# Patient Record
Sex: Female | Born: 1985 | Race: White | Hispanic: No | Smoking: Current every day smoker
Health system: Southern US, Community
[De-identification: ages and names within clinical notes are randomized; demographics above are authoritative.]

## PROBLEM LIST (undated history)

## (undated) DIAGNOSIS — F319 Bipolar disorder, unspecified: Secondary | ICD-10-CM

## (undated) DIAGNOSIS — F411 Generalized anxiety disorder: Secondary | ICD-10-CM

## (undated) DIAGNOSIS — F1911 Other psychoactive substance abuse, in remission: Secondary | ICD-10-CM

## (undated) DIAGNOSIS — F909 Attention-deficit hyperactivity disorder, unspecified type: Secondary | ICD-10-CM

## (undated) DIAGNOSIS — Z8742 Personal history of other diseases of the female genital tract: Secondary | ICD-10-CM

## (undated) DIAGNOSIS — R87629 Unspecified abnormal cytological findings in specimens from vagina: Secondary | ICD-10-CM

## (undated) HISTORY — PX: NO PAST SURGERIES: SHX2092

## (undated) HISTORY — PX: LEEP: SHX91

---
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
## 2007-05-16 ENCOUNTER — Emergency Department (HOSPITAL_COMMUNITY): Admission: EM | Admit: 2007-05-16 | Discharge: 2007-05-16 | Payer: Self-pay | Admitting: Family Medicine

## 2008-07-31 ENCOUNTER — Emergency Department (HOSPITAL_COMMUNITY): Admission: EM | Admit: 2008-07-31 | Discharge: 2008-07-31 | Payer: Self-pay | Admitting: Emergency Medicine

## 2008-08-08 ENCOUNTER — Emergency Department (HOSPITAL_COMMUNITY): Admission: EM | Admit: 2008-08-08 | Discharge: 2008-08-08 | Payer: Self-pay | Admitting: Family Medicine

## 2009-09-26 ENCOUNTER — Emergency Department (HOSPITAL_COMMUNITY): Admission: EM | Admit: 2009-09-26 | Discharge: 2009-09-26 | Payer: Self-pay | Admitting: Emergency Medicine

## 2009-10-25 ENCOUNTER — Ambulatory Visit (HOSPITAL_COMMUNITY): Admission: RE | Admit: 2009-10-25 | Discharge: 2009-10-25 | Payer: Self-pay | Admitting: Psychiatry

## 2010-10-02 LAB — URINALYSIS, ROUTINE W REFLEX MICROSCOPIC
Bilirubin Urine: NEGATIVE
Ketones, ur: NEGATIVE mg/dL
Nitrite: NEGATIVE
Protein, ur: 100 mg/dL — AB
Specific Gravity, Urine: 1.025 (ref 1.005–1.030)
Urobilinogen, UA: 0.2 mg/dL (ref 0.0–1.0)

## 2010-10-02 LAB — URINE MICROSCOPIC-ADD ON

## 2010-10-02 LAB — GC/CHLAMYDIA PROBE AMP, GENITAL
Chlamydia, DNA Probe: POSITIVE — AB
GC Probe Amp, Genital: NEGATIVE

## 2010-10-02 LAB — WET PREP, GENITAL
Clue Cells Wet Prep HPF POC: NONE SEEN
Trich, Wet Prep: NONE SEEN
Yeast Wet Prep HPF POC: NONE SEEN

## 2010-10-02 LAB — POCT PREGNANCY, URINE: Preg Test, Ur: NEGATIVE

## 2010-10-31 ENCOUNTER — Emergency Department (HOSPITAL_COMMUNITY): Payer: No Typology Code available for payment source

## 2010-10-31 ENCOUNTER — Emergency Department (HOSPITAL_COMMUNITY)
Admission: EM | Admit: 2010-10-31 | Discharge: 2010-10-31 | Disposition: A | Discharge status: Home / Self Care | Payer: No Typology Code available for payment source | Attending: Emergency Medicine | Admitting: Emergency Medicine

## 2010-10-31 DIAGNOSIS — S63509A Unspecified sprain of unspecified wrist, initial encounter: Secondary | ICD-10-CM | POA: Insufficient documentation

## 2010-10-31 DIAGNOSIS — S93609A Unspecified sprain of unspecified foot, initial encounter: Secondary | ICD-10-CM | POA: Insufficient documentation

## 2010-10-31 DIAGNOSIS — IMO0002 Reserved for concepts with insufficient information to code with codable children: Secondary | ICD-10-CM | POA: Insufficient documentation

## 2010-10-31 DIAGNOSIS — Y92410 Unspecified street and highway as the place of occurrence of the external cause: Secondary | ICD-10-CM | POA: Insufficient documentation

## 2010-11-08 ENCOUNTER — Ambulatory Visit (HOSPITAL_COMMUNITY)
Admission: RE | Admit: 2010-11-08 | Discharge: 2010-11-08 | Disposition: A | Discharge status: Home / Self Care | Payer: BC Managed Care – PPO | Attending: Psychiatry | Admitting: Psychiatry

## 2010-11-08 DIAGNOSIS — F39 Unspecified mood [affective] disorder: Secondary | ICD-10-CM | POA: Insufficient documentation

## 2010-11-08 DIAGNOSIS — F1920 Other psychoactive substance dependence, uncomplicated: Secondary | ICD-10-CM | POA: Insufficient documentation

## 2010-11-12 ENCOUNTER — Emergency Department (HOSPITAL_COMMUNITY)
Admission: EM | Admit: 2010-11-12 | Discharge: 2010-11-13 | Disposition: A | Discharge status: Other Acute Inpatient Hospital | Payer: BC Managed Care – PPO | Attending: Emergency Medicine | Admitting: Emergency Medicine

## 2010-11-12 DIAGNOSIS — F1910 Other psychoactive substance abuse, uncomplicated: Secondary | ICD-10-CM | POA: Insufficient documentation

## 2010-11-12 DIAGNOSIS — F988 Other specified behavioral and emotional disorders with onset usually occurring in childhood and adolescence: Secondary | ICD-10-CM | POA: Insufficient documentation

## 2010-11-12 LAB — DIFFERENTIAL
Basophils Absolute: 0 10*3/uL (ref 0.0–0.1)
Lymphocytes Relative: 47 % — ABNORMAL HIGH (ref 12–46)
Lymphs Abs: 3.5 10*3/uL (ref 0.7–4.0)
Neutro Abs: 3.3 10*3/uL (ref 1.7–7.7)
Neutrophils Relative %: 44 % (ref 43–77)

## 2010-11-12 LAB — CBC
HCT: 41.1 % (ref 36.0–46.0)
MCV: 96 fL (ref 78.0–100.0)
RBC: 4.28 MIL/uL (ref 3.87–5.11)
RDW: 11.9 % (ref 11.5–15.5)
WBC: 7.5 10*3/uL (ref 4.0–10.5)

## 2010-11-12 LAB — URINALYSIS, ROUTINE W REFLEX MICROSCOPIC
Ketones, ur: NEGATIVE mg/dL
Nitrite: NEGATIVE
Protein, ur: NEGATIVE mg/dL
Urobilinogen, UA: 0.2 mg/dL (ref 0.0–1.0)

## 2010-11-12 LAB — COMPREHENSIVE METABOLIC PANEL
ALT: 12 U/L (ref 0–35)
AST: 16 U/L (ref 0–37)
Alkaline Phosphatase: 56 U/L (ref 39–117)
CO2: 23 mEq/L (ref 19–32)
Calcium: 9.2 mg/dL (ref 8.4–10.5)
GFR calc Af Amer: >60 mL/min (ref >60)
Glucose, Bld: 79 mg/dL (ref 70–99)
Potassium: 3.3 mEq/L — ABNORMAL LOW (ref 3.5–5.1)
Sodium: 141 mEq/L (ref 135–145)
Total Protein: 6.9 g/dL (ref 6.0–8.3)

## 2010-11-12 LAB — ETHANOL: Alcohol, Ethyl (B): <11 mg/dL — ABNORMAL HIGH (ref 0–10)

## 2010-11-12 LAB — RAPID URINE DRUG SCREEN, HOSP PERFORMED
Benzodiazepines: NOT DETECTED
Cocaine: NOT DETECTED
Tetrahydrocannabinol: POSITIVE — AB

## 2010-11-12 LAB — URINE MICROSCOPIC-ADD ON

## 2010-11-13 ENCOUNTER — Inpatient Hospital Stay (HOSPITAL_COMMUNITY)
Admission: AD | Admit: 2010-11-13 | Discharge: 2010-11-17 | DRG: 745 | Disposition: A | Discharge status: Home / Self Care | Payer: BC Managed Care – PPO | Source: Ambulatory Visit | Attending: Psychiatry | Admitting: Psychiatry

## 2010-11-13 DIAGNOSIS — Z6379 Other stressful life events affecting family and household: Secondary | ICD-10-CM

## 2010-11-13 DIAGNOSIS — F1920 Other psychoactive substance dependence, uncomplicated: Principal | ICD-10-CM

## 2010-11-13 DIAGNOSIS — F39 Unspecified mood [affective] disorder: Secondary | ICD-10-CM

## 2010-11-13 DIAGNOSIS — K5900 Constipation, unspecified: Secondary | ICD-10-CM

## 2010-11-13 DIAGNOSIS — F411 Generalized anxiety disorder: Secondary | ICD-10-CM

## 2010-11-13 DIAGNOSIS — Z818 Family history of other mental and behavioral disorders: Secondary | ICD-10-CM

## 2010-11-13 DIAGNOSIS — G8929 Other chronic pain: Secondary | ICD-10-CM

## 2010-11-14 DIAGNOSIS — F1920 Other psychoactive substance dependence, uncomplicated: Secondary | ICD-10-CM

## 2010-11-20 ENCOUNTER — Other Ambulatory Visit (HOSPITAL_COMMUNITY): Payer: BC Managed Care – PPO | Attending: Psychiatry | Admitting: Psychiatry

## 2010-11-20 DIAGNOSIS — F1920 Other psychoactive substance dependence, uncomplicated: Secondary | ICD-10-CM | POA: Insufficient documentation

## 2010-11-22 ENCOUNTER — Other Ambulatory Visit (HOSPITAL_BASED_OUTPATIENT_CLINIC_OR_DEPARTMENT_OTHER): Payer: BC Managed Care – PPO | Admitting: Psychiatry

## 2010-11-22 DIAGNOSIS — F1020 Alcohol dependence, uncomplicated: Secondary | ICD-10-CM
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
## 2010-11-24 ENCOUNTER — Other Ambulatory Visit (HOSPITAL_COMMUNITY): Payer: BC Managed Care – PPO | Admitting: Psychiatry

## 2010-11-27 ENCOUNTER — Other Ambulatory Visit (HOSPITAL_COMMUNITY): Payer: BC Managed Care – PPO | Admitting: Psychiatry

## 2010-11-29 ENCOUNTER — Other Ambulatory Visit (HOSPITAL_COMMUNITY): Payer: BC Managed Care – PPO | Admitting: Psychiatry

## 2010-12-01 ENCOUNTER — Other Ambulatory Visit (HOSPITAL_COMMUNITY): Payer: BC Managed Care – PPO | Admitting: Psychiatry

## 2010-12-05 ENCOUNTER — Other Ambulatory Visit (HOSPITAL_COMMUNITY): Payer: BC Managed Care – PPO | Attending: Psychiatry | Admitting: Psychiatry

## 2010-12-05 DIAGNOSIS — F1920 Other psychoactive substance dependence, uncomplicated: Secondary | ICD-10-CM | POA: Insufficient documentation

## 2010-12-06 ENCOUNTER — Other Ambulatory Visit (HOSPITAL_COMMUNITY): Payer: BC Managed Care – PPO | Admitting: Psychiatry

## 2010-12-06 ENCOUNTER — Other Ambulatory Visit (HOSPITAL_COMMUNITY): Payer: Self-pay | Admitting: Psychiatry

## 2010-12-07 LAB — DRUGS OF ABUSE SCREEN W/O ALC, ROUTINE URINE
Barbiturate Quant, Ur: NEGATIVE
Benzodiazepines.: NEGATIVE
Cocaine Metabolites: NEGATIVE
Methadone: NEGATIVE
Opiate Screen, Urine: NEGATIVE
Phencyclidine (PCP): NEGATIVE

## 2010-12-08 ENCOUNTER — Other Ambulatory Visit (HOSPITAL_COMMUNITY): Payer: No Typology Code available for payment source | Admitting: Psychiatry

## 2010-12-08 ENCOUNTER — Other Ambulatory Visit (HOSPITAL_COMMUNITY): Payer: BC Managed Care – PPO | Attending: Psychiatry | Admitting: Psychiatry

## 2010-12-08 DIAGNOSIS — Z6379 Other stressful life events affecting family and household: Secondary | ICD-10-CM | POA: Insufficient documentation

## 2010-12-08 DIAGNOSIS — F1920 Other psychoactive substance dependence, uncomplicated: Secondary | ICD-10-CM | POA: Insufficient documentation

## 2010-12-08 DIAGNOSIS — Z818 Family history of other mental and behavioral disorders: Secondary | ICD-10-CM | POA: Insufficient documentation

## 2010-12-11 ENCOUNTER — Other Ambulatory Visit (HOSPITAL_COMMUNITY): Payer: No Typology Code available for payment source | Admitting: Psychiatry

## 2010-12-11 ENCOUNTER — Other Ambulatory Visit (HOSPITAL_COMMUNITY): Payer: BC Managed Care – PPO | Admitting: Psychiatry

## 2010-12-13 ENCOUNTER — Other Ambulatory Visit (HOSPITAL_COMMUNITY): Payer: No Typology Code available for payment source | Admitting: Psychiatry

## 2010-12-13 ENCOUNTER — Other Ambulatory Visit (HOSPITAL_COMMUNITY): Payer: BC Managed Care – PPO | Admitting: Psychiatry

## 2010-12-15 ENCOUNTER — Other Ambulatory Visit (HOSPITAL_COMMUNITY): Payer: No Typology Code available for payment source | Admitting: Psychiatry

## 2010-12-15 ENCOUNTER — Other Ambulatory Visit (HOSPITAL_COMMUNITY): Payer: BC Managed Care – PPO | Admitting: Psychiatry

## 2010-12-18 ENCOUNTER — Other Ambulatory Visit (HOSPITAL_COMMUNITY): Payer: BC Managed Care – PPO | Admitting: Psychiatry

## 2010-12-18 ENCOUNTER — Other Ambulatory Visit (HOSPITAL_COMMUNITY): Payer: No Typology Code available for payment source | Admitting: Psychiatry

## 2010-12-18 ENCOUNTER — Other Ambulatory Visit (HOSPITAL_COMMUNITY): Payer: Self-pay | Admitting: Psychiatry

## 2010-12-19 LAB — URINE DRUGS OF ABUSE SCREEN W ALC, ROUTINE (REF LAB)
Benzodiazepines.: NEGATIVE
Cocaine Metabolites: NEGATIVE
Marijuana Metabolite: NEGATIVE
Opiate Screen, Urine: NEGATIVE
Phencyclidine (PCP): NEGATIVE
Propoxyphene: NEGATIVE

## 2010-12-20 ENCOUNTER — Other Ambulatory Visit (HOSPITAL_COMMUNITY): Payer: No Typology Code available for payment source | Admitting: Psychiatry

## 2010-12-20 ENCOUNTER — Other Ambulatory Visit (HOSPITAL_COMMUNITY): Payer: BC Managed Care – PPO | Admitting: Psychiatry

## 2010-12-22 ENCOUNTER — Other Ambulatory Visit (HOSPITAL_COMMUNITY): Payer: BC Managed Care – PPO | Admitting: Psychiatry

## 2010-12-22 ENCOUNTER — Other Ambulatory Visit (HOSPITAL_COMMUNITY): Payer: No Typology Code available for payment source | Admitting: Psychiatry

## 2010-12-25 ENCOUNTER — Other Ambulatory Visit (HOSPITAL_COMMUNITY): Payer: BC Managed Care – PPO | Admitting: Psychiatry

## 2010-12-25 ENCOUNTER — Other Ambulatory Visit (HOSPITAL_COMMUNITY): Payer: No Typology Code available for payment source | Admitting: Psychiatry

## 2010-12-27 ENCOUNTER — Other Ambulatory Visit (HOSPITAL_COMMUNITY): Payer: No Typology Code available for payment source | Admitting: Psychiatry

## 2010-12-27 ENCOUNTER — Other Ambulatory Visit (HOSPITAL_COMMUNITY): Payer: BC Managed Care – PPO | Admitting: Psychiatry

## 2010-12-29 ENCOUNTER — Other Ambulatory Visit (HOSPITAL_COMMUNITY): Payer: BC Managed Care – PPO | Admitting: Psychiatry

## 2010-12-29 ENCOUNTER — Other Ambulatory Visit (HOSPITAL_COMMUNITY): Payer: No Typology Code available for payment source | Admitting: Psychiatry

## 2010-12-29 ENCOUNTER — Other Ambulatory Visit (HOSPITAL_COMMUNITY): Payer: Self-pay | Admitting: Psychiatry

## 2010-12-30 LAB — URINE DRUGS OF ABUSE SCREEN W ALC, ROUTINE (REF LAB)
Benzodiazepines.: NEGATIVE
Marijuana Metabolite: NEGATIVE
Opiate Screen, Urine: NEGATIVE
Phencyclidine (PCP): NEGATIVE
Propoxyphene: NEGATIVE

## 2011-01-01 ENCOUNTER — Other Ambulatory Visit (HOSPITAL_COMMUNITY): Payer: No Typology Code available for payment source | Admitting: Psychiatry

## 2011-01-01 ENCOUNTER — Other Ambulatory Visit (HOSPITAL_COMMUNITY): Payer: BC Managed Care – PPO | Admitting: Psychiatry

## 2011-01-03 ENCOUNTER — Other Ambulatory Visit (HOSPITAL_COMMUNITY): Payer: BC Managed Care – PPO | Admitting: Psychiatry

## 2011-01-03 ENCOUNTER — Other Ambulatory Visit (HOSPITAL_COMMUNITY): Payer: No Typology Code available for payment source | Admitting: Psychiatry

## 2011-01-05 ENCOUNTER — Other Ambulatory Visit (HOSPITAL_COMMUNITY): Payer: BC Managed Care – PPO | Admitting: Psychiatry

## 2011-01-05 ENCOUNTER — Other Ambulatory Visit (HOSPITAL_COMMUNITY): Payer: No Typology Code available for payment source | Admitting: Psychiatry

## 2011-01-08 ENCOUNTER — Other Ambulatory Visit (HOSPITAL_COMMUNITY): Payer: No Typology Code available for payment source | Admitting: Psychiatry

## 2011-01-12 ENCOUNTER — Other Ambulatory Visit (HOSPITAL_COMMUNITY): Payer: No Typology Code available for payment source | Admitting: Psychiatry

## 2011-01-15 ENCOUNTER — Other Ambulatory Visit (HOSPITAL_COMMUNITY): Payer: BC Managed Care – PPO | Attending: Psychiatry | Admitting: Psychiatry

## 2011-01-15 ENCOUNTER — Other Ambulatory Visit (HOSPITAL_COMMUNITY): Payer: Self-pay | Admitting: Psychiatry

## 2011-01-15 DIAGNOSIS — F1920 Other psychoactive substance dependence, uncomplicated: Secondary | ICD-10-CM | POA: Insufficient documentation

## 2011-01-15 DIAGNOSIS — Z818 Family history of other mental and behavioral disorders: Secondary | ICD-10-CM | POA: Insufficient documentation

## 2011-01-15 DIAGNOSIS — G8929 Other chronic pain: Secondary | ICD-10-CM | POA: Insufficient documentation

## 2011-01-15 DIAGNOSIS — Z6379 Other stressful life events affecting family and household: Secondary | ICD-10-CM | POA: Insufficient documentation

## 2011-01-16 LAB — URINE DRUGS OF ABUSE SCREEN W ALC, ROUTINE (REF LAB)
Benzodiazepines.: NEGATIVE
Marijuana Metabolite: NEGATIVE
Opiate Screen, Urine: NEGATIVE
Phencyclidine (PCP): NEGATIVE
Propoxyphene: NEGATIVE

## 2011-01-17 ENCOUNTER — Other Ambulatory Visit (HOSPITAL_COMMUNITY): Payer: BC Managed Care – PPO | Admitting: Psychiatry

## 2011-01-19 ENCOUNTER — Other Ambulatory Visit (HOSPITAL_COMMUNITY): Payer: BC Managed Care – PPO | Admitting: Psychiatry

## 2011-01-22 ENCOUNTER — Other Ambulatory Visit (HOSPITAL_COMMUNITY): Payer: BC Managed Care – PPO | Attending: Psychiatry | Admitting: Psychiatry

## 2011-01-22 DIAGNOSIS — Z6379 Other stressful life events affecting family and household: Secondary | ICD-10-CM | POA: Insufficient documentation

## 2011-01-22 DIAGNOSIS — Z818 Family history of other mental and behavioral disorders: Secondary | ICD-10-CM | POA: Insufficient documentation

## 2011-01-22 DIAGNOSIS — G8929 Other chronic pain: Secondary | ICD-10-CM | POA: Insufficient documentation

## 2011-01-22 DIAGNOSIS — F1920 Other psychoactive substance dependence, uncomplicated: Secondary | ICD-10-CM | POA: Insufficient documentation

## 2011-01-24 ENCOUNTER — Other Ambulatory Visit (HOSPITAL_COMMUNITY): Payer: BC Managed Care – PPO | Admitting: Psychiatry

## 2011-01-24 ENCOUNTER — Other Ambulatory Visit (HOSPITAL_COMMUNITY): Payer: Self-pay | Admitting: Psychiatry

## 2011-01-24 NOTE — Assessment & Plan Note (Signed)
NAMEKEWANDA, POLAND NO.:  [id number]  MEDICAL RECORD NO.:  [id number]           PATIENT TYPE:  I  LOCATION:  0307                          FACILITY:  BH  PHYSICIAN:  Anselm Jungling, MD  DATE OF BIRTH:  1986/03/22  DATE OF ADMISSION:  11/13/2010 DATE OF DISCHARGE:                      PSYCHIATRIC ADMISSION ASSESSMENT   HISTORY OF PRESENT ILLNESS:  The patient is a 25 year old, single, white female who presents for detox stating that she wants to go to [facility] the residential treatment facility in [place], [place].  The patient had recently been scheduled to go into the [facility] but was unable to discontinue using.  The patient reports she has been abusing 1 mg of green monsters 12 tablets in 2 days, Adderall 20 mg two a day, and muscle relaxers.  She has used Opana, Percocet, Oxy and Roxy as recently as 2 weeks ago.  Cocaine 1 month ago.  She has not done heroin into 2- 1/2 years.  She has also used LSD and mushrooms.  PAST PSYCHIATRIC HISTORY:  The patient reports being treated at [facility] treatment center in 11/2006.  SOCIAL HISTORY:  She is single.  She has lived on her own in the past but recently moved in with her parents.  She has one biological brother and is one of 10 children, the majority of whom are adopted cousins. 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  She has no children of her own.  No legal issues pending and no military history.  She was born in [place].  FAMILY HISTORY:  She reports a strong history of substance abuse on her mother's side in the family noting that her mother is  one of 7 children and she has 49 aunts and uncles who were all substance abusers. On the father's side, her paternal grandmother uses Xanax for depression and panic.  ALCOHOL AND DRUG HISTORY:  The patient started at age 67-18.  MEDICAL HISTORY:  Primary  care provider is Clayborn Bigness at [facility].  MEDICAL PROBLEMS:  None.  MEDICATIONS:  Adderall 20 mg two p.o. daily.  DRUG ALLERGIES:  Biaxin.  The patient presented to the [facility] emergency room requesting assistance with detox. Physical examination was unremarkable and done by Dr. Azalia Bilis.  Significant laboratory results include a CBC with diff well within normal limits.  Urine pregnancy test which is negative. Comprehensive metabolic profile shows potassium at 3.3 minimally low and a total bili at 0.1 minimally low.  Alcohol level is reported again as less than 11, documented as high.  Drugs of abuse indicates urine drug screen of abuse tested positive only for cannabis.  Urine microscopic was unremarkable with a small amount of leukocyte esterase.  Microscopic reveals a few epithelials, rare bacteria.  The patient was then transferred to behavioral health.  MENTAL HEALTH EVALUATION:  The patient is a well-developed, well- nourished, thin, white female wearing hospital scrubs, slightly disheveled who is  in no apparent distress and non-ill appearing, her chronological age.  She  has long brunette hair tied back in a pony tail. She is alert and oriented x4. She makes good eye contact.  She is cooperative and calm.  Her speech is clear, coherent, relevant and goal directed.  Normal rate and rhythm.  Mood is euthymic.  Affect is congruent and calm.  Thought process is linear and the patient is in full contact with reality.  She denies auditory or visual hallucination. Cognition is intact and is of at least average intelligence.  The patient denies suicidality or homicidality.  ASSESSMENT:  Axis I:  Polysubstance abuse. Axis II:  Negative. Axis III:  None. Axis IV:  None. Axis V:  Current GAF 45.  PLAN:  The patient will be admitted for detox and evaluation, tentative length of stay is 3-5 days.    ______________________________ Verne Spurr,  PA   ______________________________ Anselm Jungling, MD    NM/MEDQ  D:  11/14/2010  T:  11/14/2010  Job:  [id number]  Electronically Signed by Verne Spurr  on 01/22/2011 03:53:42 PM Electronically Signed by Nelly Rout MD on 01/24/2011 11:42:01 AM

## 2011-01-25 LAB — URINE DRUGS OF ABUSE SCREEN W ALC, ROUTINE (REF LAB)
Barbiturate Quant, Ur: NEGATIVE
Benzodiazepines.: NEGATIVE
Marijuana Metabolite: NEGATIVE
Opiate Screen, Urine: NEGATIVE
Propoxyphene: NEGATIVE

## 2011-01-26 ENCOUNTER — Other Ambulatory Visit (HOSPITAL_COMMUNITY): Payer: BC Managed Care – PPO | Admitting: Psychiatry

## 2011-01-29 ENCOUNTER — Other Ambulatory Visit (HOSPITAL_COMMUNITY): Payer: BC Managed Care – PPO | Admitting: Psychiatry

## 2011-01-31 ENCOUNTER — Other Ambulatory Visit (HOSPITAL_COMMUNITY): Payer: BC Managed Care – PPO | Admitting: Psychiatry

## 2011-02-02 ENCOUNTER — Other Ambulatory Visit (HOSPITAL_COMMUNITY): Payer: BC Managed Care – PPO | Admitting: Psychiatry

## 2011-02-14 ENCOUNTER — Other Ambulatory Visit (HOSPITAL_COMMUNITY): Payer: Self-pay | Admitting: Psychiatry

## 2011-02-15 LAB — URINE DRUGS OF ABUSE SCREEN W ALC, ROUTINE (REF LAB)
Barbiturate Quant, Ur: NEGATIVE
Benzodiazepines.: NEGATIVE
Marijuana Metabolite: NEGATIVE
Propoxyphene: NEGATIVE

## 2014-05-23 ENCOUNTER — Encounter (HOSPITAL_BASED_OUTPATIENT_CLINIC_OR_DEPARTMENT_OTHER): Payer: Self-pay | Admitting: Emergency Medicine

## 2014-05-23 ENCOUNTER — Emergency Department (HOSPITAL_BASED_OUTPATIENT_CLINIC_OR_DEPARTMENT_OTHER)
Admission: EM | Admit: 2014-05-23 | Discharge: 2014-05-24 | Disposition: A | Discharge status: Home / Self Care | Payer: BC Managed Care – PPO | Attending: Emergency Medicine | Admitting: Emergency Medicine

## 2014-05-23 DIAGNOSIS — R0789 Other chest pain: Secondary | ICD-10-CM

## 2014-05-23 DIAGNOSIS — J40 Bronchitis, not specified as acute or chronic: Secondary | ICD-10-CM | POA: Insufficient documentation

## 2014-05-23 DIAGNOSIS — Z720 Tobacco use: Secondary | ICD-10-CM | POA: Insufficient documentation

## 2014-05-23 DIAGNOSIS — Z79899 Other long term (current) drug therapy: Secondary | ICD-10-CM | POA: Insufficient documentation

## 2014-05-23 DIAGNOSIS — J069 Acute upper respiratory infection, unspecified: Secondary | ICD-10-CM | POA: Insufficient documentation

## 2014-05-23 DIAGNOSIS — J209 Acute bronchitis, unspecified: Secondary | ICD-10-CM

## 2014-05-23 DIAGNOSIS — M791 Myalgia: Secondary | ICD-10-CM | POA: Insufficient documentation

## 2014-05-23 MED ORDER — ALBUTEROL SULFATE (2.5 MG/3ML) 0.083% IN NEBU
5.0000 mg | INHALATION_SOLUTION | Freq: Once | RESPIRATORY_TRACT | Status: AC
  Administered 2014-05-23: 5 mg via RESPIRATORY_TRACT
  Filled 2014-05-23: qty 6

## 2014-05-23 MED ORDER — TRAMADOL HCL 50 MG PO TABS
50.0000 mg | ORAL_TABLET | Freq: Once | ORAL | Status: AC
  Administered 2014-05-23: 50 mg via ORAL
  Filled 2014-05-23: qty 1

## 2014-05-23 MED ORDER — METHYLPREDNISOLONE SODIUM SUCC 125 MG IJ SOLR
125.0000 mg | Freq: Once | INTRAMUSCULAR | Status: AC
  Administered 2014-05-23: 125 mg via INTRAMUSCULAR
  Filled 2014-05-23: qty 2

## 2014-05-23 NOTE — ED Provider Notes (Signed)
CSN: [id number]     Arrival date & time 05/23/14  2201 History  This chart was scribed for Loren Raceravid Rendy Lazard, MD by Roxy Cedarhandni Bhalodia, ED Scribe. This patient was seen in room MH04/MH04 and the patient's care was started at 11:15 PM.   Chief Complaint  Patient presents with  . URI   Patient is a 28 y.o. female presenting with URI. The history is provided by the patient. No language interpreter was used.  URI Presenting symptoms: congestion and cough   Presenting symptoms: no fever, no rhinorrhea and no sore throat   Severity:  Moderate Onset quality:  Gradual Duration:  1 week Timing:  Constant Progression:  Unchanged Associated symptoms: myalgias and wheezing   Associated symptoms: no headaches and no neck pain     HPI Comments: Heather Hines is a 28 y.o. female who presents to the Emergency Department complaining of fever and cough that began 1 week ago. She also had associated body aches, sinus pressure, and congestion that began last week. She admits to taking several days of Tamiflu with improvement of symptoms. She has developed gradually increasing coughing with right-sided chest wall pain over the last few days. Cough is nonproductive. She states that she went to an ED in [place] last night and was given Toradol that helped her chest pain. X-ray was performed without any evidence of pneumonia. Told she had an URI and prescribed NSAIDs. Patient does not want to take narcotic medication to treat chest pain. She states that she is currently experiencing chest pain. She states that the pain worsens with breathing, and movement. She states she took Tessalon, ibuprofen and Dayquil at 9:00 AM this morning. She denies any recent surgeries.  Patient with no recent surgeries or extended travel. She has no lower extremity swelling or pain. No personal history of DVT/pneumonia.  History reviewed. No pertinent past medical history. History reviewed. No pertinent past surgical history. No family  history on file. History  Substance Use Topics  . Smoking status: Current Every Day Smoker  . Smokeless tobacco: Not on file  . Alcohol Use: Not on file   OB History    No data available     Review of Systems  Constitutional: Negative for fever and chills.  HENT: Positive for congestion. Negative for rhinorrhea, sinus pressure and sore throat.   Respiratory: Positive for cough, chest tightness and wheezing. Negative for shortness of breath.   Cardiovascular: Positive for chest pain. Negative for palpitations and leg swelling.  Gastrointestinal: Negative for nausea, vomiting, abdominal pain and diarrhea.  Musculoskeletal: Positive for myalgias. Negative for back pain, neck pain and neck stiffness.  Skin: Negative for rash and wound.  Neurological: Negative for dizziness, weakness, light-headedness, numbness and headaches.  All other systems reviewed and are negative.  Allergies  Biaxin  Home Medications   Prior to Admission medications   Medication Sig Start Date End Date Taking? Authorizing Provider  lamoTRIgine (LAMICTAL) 25 MG tablet Take 50 mg by mouth daily.   Yes Historical Provider, MD  traZODone (DESYREL) 100 MG tablet Take 100 mg by mouth at bedtime.   Yes Historical Provider, MD  albuterol (PROVENTIL HFA;VENTOLIN HFA) 108 (90 BASE) MCG/ACT inhaler Inhale 1-2 puffs into the lungs every 6 (six) hours as needed for wheezing or shortness of breath. 05/24/14   Loren Raceravid Symphonie Schneiderman, MD  predniSONE (DELTASONE) 20 MG tablet 3 tabs po day one, then 2 po daily x 4 days 05/24/14   Loren Raceravid Ninfa Giannelli, MD  traMADol Janean Sark(ULTRAM) 50 MG tablet  Take 1 tablet (50 mg total) by mouth every 6 (six) hours as needed. 05/24/14   Loren Raceravid Laurel Harnden, MD   Triage Vitals: BP 115/70 mmHg  Pulse 63  Temp(Src) 98.2 F (36.8 C) (Oral)  Resp 14  Ht 5\' 6"  (1.676 m)  Wt 152 lb (68.947 kg)  BMI 24.55 kg/m2  SpO2 96% Physical Exam  Constitutional: She is oriented to person, place, and time. She appears  well-developed and well-nourished. No distress.  HENT:  Head: Normocephalic and atraumatic.  Mouth/Throat: Oropharynx is clear and moist. No oropharyngeal exudate.  Bilateral nasal mucosal edema.  Eyes: EOM are normal. Pupils are equal, round, and reactive to light.  Neck: Normal range of motion. Neck supple.  Cardiovascular: Normal rate and regular rhythm.   Pulmonary/Chest: Effort normal. No respiratory distress. She has wheezes (expiratory wheezing throughout especially in the right lung fields.). She has no rales. She exhibits tenderness (tenderness to palpation in these right interior chest. No crepitance or deformity.).  Abdominal: Soft. Bowel sounds are normal. She exhibits no distension and no mass. There is no tenderness. There is no rebound and no guarding.  Musculoskeletal: Normal range of motion. She exhibits no edema or tenderness.  No calf swelling or tenderness.  Neurological: She is alert and oriented to person, place, and time.  Skin: Skin is warm and dry. No rash noted. She is not diaphoretic. No erythema.  Psychiatric: She has a normal mood and affect. Her behavior is normal.  Nursing note and vitals reviewed.   ED Course  Procedures (including critical care time)  DIAGNOSTIC STUDIES: Oxygen Saturation is 96% on RA, normal by my interpretation.    COORDINATION OF CARE: 11:19 PM- Discussed plans to give patient a nebulizer breathing treatment and steroid medications. Pt advised of plan for treatment and pt agrees.  Labs Review Labs Reviewed - No data to display  Imaging Review No results found.   EKG Interpretation None     MDM   Final diagnoses:  Bronchitis with bronchospasm  Right-sided chest wall pain      I personally performed the services described in this documentation, which was scribed in my presence. The recorded information has been reviewed and is accurate.  Patient with pleuritic right-sided chest pain consistent with muscle strain.  Reproduced with palpation. Patient with recent chest x-ray. Do not believe repeat imaging is necessary at this time. Low suspicion for PE. PERC negative. Likely exacerbated by bronchospasm. Get steroids and breathing treatment and reevaluate.  Wheezing improved. Patient states the chest pain is improved as well. No respiratory distress. Discharge home with short course of prednisone an MDI. Return precautions given.  Loren Raceravid Geral Coker, MD 05/24/14 [phone]

## 2014-05-23 NOTE — ED Notes (Signed)
Pt presents to ED with complaints of cough and flu like symptoms. Pt states she went to an ED in [place] lastnight and was told she had URI and was given toradol that helped her rib pain. Pt states she is in recovery and does not want to take narcs but needs something for the rib pain. PT  States she had flu like symptoms a week ago and took a couple tamiflu pills that her mom had left over. And then started coughing..Marland Kitchen

## 2014-05-23 NOTE — ED Notes (Signed)
Triage completed by this RN. 

## 2014-05-24 MED ORDER — PREDNISONE 20 MG PO TABS: ORAL_TABLET | ORAL | Status: DC

## 2014-05-24 MED ORDER — ALBUTEROL SULFATE HFA 108 (90 BASE) MCG/ACT IN AERS: 1.0000 | INHALATION_SPRAY | Freq: Four times a day (QID) | RESPIRATORY_TRACT | Status: DC | PRN

## 2014-05-24 MED ORDER — TRAMADOL HCL 50 MG PO TABS: 50.0000 mg | ORAL_TABLET | Freq: Four times a day (QID) | ORAL | Status: DC | PRN

## 2014-05-24 MED ORDER — ALBUTEROL SULFATE HFA 108 (90 BASE) MCG/ACT IN AERS
2.0000 | INHALATION_SPRAY | Freq: Once | RESPIRATORY_TRACT | Status: AC
  Administered 2014-05-24: 2 via RESPIRATORY_TRACT
  Filled 2014-05-24: qty 6.7

## 2014-05-24 NOTE — Discharge Instructions (Signed)
Chest Wall Pain Chest wall pain is pain in or around the bones and muscles of your chest. It may take up to 6 weeks to get better. It may take longer if you must stay physically active in your work and activities.  CAUSES  Chest wall pain may happen on its own. However, it may be caused by:  A viral illness like the flu.  Injury.  Coughing.  Exercise.  Arthritis.  Fibromyalgia.  Shingles. HOME CARE INSTRUCTIONS   Avoid overtiring physical activity. Try not to strain or perform activities that cause pain. This includes any activities using your chest or your abdominal and side muscles, especially if heavy weights are used.  Put ice on the sore area.  Put ice in a plastic bag.  Place a towel between your skin and the bag.  Leave the ice on for 15-20 minutes per hour while awake for the first 2 days.  Only take over-the-counter or prescription medicines for pain, discomfort, or fever as directed by your caregiver. SEEK IMMEDIATE MEDICAL CARE IF:   Your pain increases, or you are very uncomfortable.  You have a fever.  Your chest pain becomes worse.  You have new, unexplained symptoms.  You have nausea or vomiting.  You feel sweaty or lightheaded.  You have a cough with phlegm (sputum), or you cough up blood. MAKE SURE YOU:   Understand these instructions.  Will watch your condition.  Will get help right away if you are not doing well or get worse. Document Released: 06/25/2005 Document Revised: 09/17/2011 Document Reviewed: 02/19/2011 [facility] Patient Information 2015 [employer], [place]. This information is not intended to replace advice given to you by your health care provider. Make sure you discuss any questions you have with your health care provider. Bronchospasm A bronchospasm is a spasm or tightening of the airways going into the lungs. During a bronchospasm breathing becomes more difficult because the airways get smaller. When this happens there can be  coughing, a whistling sound when breathing (wheezing), and difficulty breathing. Bronchospasm is often associated with asthma, but not all patients who experience a bronchospasm have asthma. CAUSES  A bronchospasm is caused by inflammation or irritation of the airways. The inflammation or irritation may be triggered by:   Allergies (such as to animals, pollen, food, or mold). Allergens that cause bronchospasm may cause wheezing immediately after exposure or many hours later.   Infection. Viral infections are believed to be the most common cause of bronchospasm.   Exercise.   Irritants (such as pollution, cigarette smoke, strong odors, aerosol sprays, and paint fumes).   Weather changes. Winds increase molds and pollens in the air. Rain refreshes the air by washing irritants out. Cold air may cause inflammation.   Stress and emotional upset.  SIGNS AND SYMPTOMS   Wheezing.   Excessive nighttime coughing.   Frequent or severe coughing with a simple cold.   Chest tightness.   Shortness of breath.  DIAGNOSIS  Bronchospasm is usually diagnosed through a history and physical exam. Tests, such as chest X-rays, are sometimes done to look for other conditions. TREATMENT   Inhaled medicines can be given to open up your airways and help you breathe. The medicines can be given using either an inhaler or a nebulizer machine.  Corticosteroid medicines may be given for severe bronchospasm, usually when it is associated with asthma. HOME CARE INSTRUCTIONS   Always have a plan prepared for seeking medical care. Know when to call your health care provider and  local emergency services (911 in the [place] Know where you can access local emergency care.  Only take medicines as directed by your health care provider.  If you were prescribed an inhaler or nebulizer machine, ask your health care provider to explain how to use it correctly. Always use a spacer with your inhaler if you were  given one.  It is necessary to remain calm during an attack. Try to relax and breathe more slowly.  Control your home environment in the following ways:   Change your heating and air conditioning filter at least once a month.   Limit your use of fireplaces and wood stoves.  Do not smoke and do not allow smoking in your home.   Avoid exposure to perfumes and fragrances.   Get rid of pests (such as roaches and mice) and their droppings.   Throw away plants if you see mold on them.   Keep your house clean and dust free.   Replace carpet with wood, tile, or vinyl flooring. Carpet can trap dander and dust.   Use allergy-proof pillows, mattress covers, and box spring covers.   Wash bed sheets and blankets every week in hot water and dry them in a dryer.   Use blankets that are made of polyester or cotton.   Wash hands frequently. SEEK MEDICAL CARE IF:   You have muscle aches.   You have chest pain.   The sputum changes from clear or white to yellow, green, gray, or bloody.   The sputum you cough up gets thicker.   There are problems that may be related to the medicine you are given, such as a rash, itching, swelling, or trouble breathing.  SEEK IMMEDIATE MEDICAL CARE IF:   You have worsening wheezing and coughing even after taking your prescribed medicines.   You have increased difficulty breathing.   You develop severe chest pain. MAKE SURE YOU:   Understand these instructions.  Will watch your condition.  Will get help right away if you are not doing well or get worse. Document Released: 06/28/2003 Document Revised: 06/30/2013 Document Reviewed: 12/15/2012 [facility] Patient Information 2015 [place], [place]. This information is not intended to replace advice given to you by your health care provider. Make sure you discuss any questions you have with your health care provider.

## 2018-01-30 NOTE — Progress Notes (Deleted)
Psychiatric Initial Adult Assessment   Patient Identification: Heather Hines MRN:  [id number] Date of Evaluation:  01/30/2018 Referral Source: *** Chief Complaint:   Visit Diagnosis: No diagnosis found.  History of Present Illness:   Heather Hines is a 32 y.o. year old female with a history of polysubstance use, who is referred for     Associated Signs/Symptoms: Depression Symptoms:  {DEPRESSION SYMPTOMS:20000} (Hypo) Manic Symptoms:  {BHH MANIC SYMPTOMS:22872} Anxiety Symptoms:  {BHH ANXIETY SYMPTOMS:22873} Psychotic Symptoms:  {BHH PSYCHOTIC SYMPTOMS:22874} PTSD Symptoms: {BHH PTSD SYMPTOMS:22875}  Past Psychiatric History: ***  Previous Psychotropic Medications: {YES/NO:21197}  Substance Abuse History in the last 12 months:  {yes no:314532}  Consequences of Substance Abuse: {BHH CONSEQUENCES OF SUBSTANCE ABUSE:22880}  Past Medical History: No past medical history on file. No past surgical history on file.  Family Psychiatric History: ***  Family History: No family history on file.  Social History:   Social History   Socioeconomic History  . Marital status: Single    Spouse name: Not on file  . Number of children: Not on file  . Years of education: Not on file  . Highest education level: Not on file  Occupational History  . Not on file  Social Needs  . Financial resource strain: Not on file  . Food insecurity:    Worry: Not on file    Inability: Not on file  . Transportation needs:    Medical: Not on file    Non-medical: Not on file  Tobacco Use  . Smoking status: Current Every Day Smoker  Substance and Sexual Activity  . Alcohol use: Not on file  . Drug use: Not on file  . Sexual activity: Not on file  Lifestyle  . Physical activity:    Days per week: Not on file    Minutes per session: Not on file  . Stress: Not on file  Relationships  . Social connections:    Talks on phone: Not on file    Gets together: Not on file    Attends religious  service: Not on file    Active member of club or organization: Not on file    Attends meetings of clubs or organizations: Not on file    Relationship status: Not on file  Other Topics Concern  . Not on file  Social History Narrative  . Not on file    Additional Social History: ***  Allergies:   Allergies  Allergen Reactions  . Biaxin [Clarithromycin]     Metabolic Disorder Labs: No results found for: HGBA1C, MPG No results found for: PROLACTIN No results found for: CHOL, TRIG, HDL, CHOLHDL, VLDL, LDLCALC   Current Medications: Current Outpatient Medications  Medication Sig Dispense Refill  . albuterol (PROVENTIL HFA;VENTOLIN HFA) 108 (90 BASE) MCG/ACT inhaler Inhale 1-2 puffs into the lungs every 6 (six) hours as needed for wheezing or shortness of breath. 1 Inhaler 0  . lamoTRIgine (LAMICTAL) 25 MG tablet Take 50 mg by mouth daily.    . predniSONE (DELTASONE) 20 MG tablet 3 tabs po day one, then 2 po daily x 4 days 11 tablet 0  . traMADol (ULTRAM) 50 MG tablet Take 1 tablet (50 mg total) by mouth every 6 (six) hours as needed. 15 tablet 0  . traZODone (DESYREL) 100 MG tablet Take 100 mg by mouth at bedtime.     No current facility-administered medications for this visit.     Neurologic: Headache: {BHH YES OR NO:22294} Seizure: {BHH YES OR NO:22294} Paresthesias:{BHH YES OR  WU:98119}O:22294}  Musculoskeletal: Strength & Muscle Tone: {desc; muscle tone:32375} Gait & Station: {PE GAIT ED JYNW:29562}ATL:22525} Patient leans: {Patient Leans:21022755}  Psychiatric Specialty Exam: ROS  There were no vitals taken for this visit.There is no height or weight on file to calculate BMI.  General Appearance: {Appearance:22683}  Eye Contact:  {BHH EYE CONTACT:22684}  Speech:  {Speech:22685}  Volume:  {Volume (PAA):22686}  Mood:  {BHH MOOD:22306}  Affect:  {Affect (PAA):22687}  Thought Process:  {Thought Process (PAA):22688}  Orientation:  {BHH ORIENTATION (PAA):22689}  Thought Content:   {Thought Content:22690}  Suicidal Thoughts:  {ST/HT (PAA):22692}  Homicidal Thoughts:  {ST/HT (PAA):22692}  Memory:  {BHH MEMORY:22881}  Judgement:  {Judgement (PAA):22694}  Insight:  {Insight (PAA):22695}  Psychomotor Activity:  {Psychomotor (PAA):22696}  Concentration:  {Concentration:21399}  Recall:  {BHH GOOD/FAIR/POOR:22877}  Fund of Knowledge:{BHH GOOD/FAIR/POOR:22877}  Language: {BHH GOOD/FAIR/POOR:22877}  Akathisia:  {BHH YES OR NO:22294}  Handed:  {Handed:22697}  AIMS (if indicated):  ***  Assets:  {Assets (PAA):22698}  ADL's:  {BHH ZHY'Q:65784}ADL'S:22290}  Cognition: {chl bhh cognition:304700322}  Sleep:  ***    Treatment Plan Summary: {CHL AMB BH MD TX ONGE:9528413244}Plan:9026631997}   Neysa Hottereina Dashana Guizar, MD 7/25/20193:46 PM

## 2018-02-06 ENCOUNTER — Other Ambulatory Visit (HOSPITAL_COMMUNITY): Payer: Self-pay | Admitting: *Deleted

## 2018-02-07 ENCOUNTER — Ambulatory Visit (HOSPITAL_COMMUNITY): Payer: Self-pay | Admitting: Psychiatry

## 2018-07-09 NOTE — L&D Delivery Note (Signed)
Vtx at +3 station and loa.  Pt desires VE.  I discussed the R&B of VE including but not limited to injury to fetus but the potential benefit of expedited delivery.  She gives her informed consent and wishes to proceed. On the 3rd pull delivered viable female apgars 7,9 over 2nd degree ML Lac   Placenta delivered spontaneously intact with 3VC. Repair with 2-0 Chromic with good support and hemostasis noted and R/V exam confirms.  PH art was sent.  Mother and baby were doing well.  EBL 150cc  Louretta Shorten, MD

## 2018-08-21 ENCOUNTER — Encounter (HOSPITAL_BASED_OUTPATIENT_CLINIC_OR_DEPARTMENT_OTHER): Payer: Self-pay | Admitting: Emergency Medicine

## 2018-08-21 ENCOUNTER — Emergency Department (HOSPITAL_BASED_OUTPATIENT_CLINIC_OR_DEPARTMENT_OTHER): Admission: EM | Admit: 2018-08-21 | Payer: Self-pay | Source: Home / Self Care

## 2018-08-21 ENCOUNTER — Other Ambulatory Visit: Payer: Self-pay

## 2018-08-21 ENCOUNTER — Emergency Department (HOSPITAL_BASED_OUTPATIENT_CLINIC_OR_DEPARTMENT_OTHER)
Admission: EM | Admit: 2018-08-21 | Discharge: 2018-08-21 | Disposition: A | Discharge status: Home / Self Care | Payer: 59 | Attending: Emergency Medicine | Admitting: Emergency Medicine

## 2018-08-21 DIAGNOSIS — R1030 Lower abdominal pain, unspecified: Secondary | ICD-10-CM | POA: Insufficient documentation

## 2018-08-21 DIAGNOSIS — F17200 Nicotine dependence, unspecified, uncomplicated: Secondary | ICD-10-CM | POA: Diagnosis not present

## 2018-08-21 DIAGNOSIS — Z79899 Other long term (current) drug therapy: Secondary | ICD-10-CM | POA: Insufficient documentation

## 2018-08-21 DIAGNOSIS — R300 Dysuria: Secondary | ICD-10-CM | POA: Diagnosis present

## 2018-08-21 DIAGNOSIS — N390 Urinary tract infection, site not specified: Secondary | ICD-10-CM | POA: Insufficient documentation

## 2018-08-21 HISTORY — DX: Attention-deficit hyperactivity disorder, unspecified type: F90.9

## 2018-08-21 HISTORY — DX: Bipolar disorder, unspecified: F31.9

## 2018-08-21 HISTORY — DX: Generalized anxiety disorder: F41.1

## 2018-08-21 HISTORY — DX: Other psychoactive substance abuse, in remission: F19.11

## 2018-08-21 LAB — URINALYSIS, ROUTINE W REFLEX MICROSCOPIC
GLUCOSE, UA: NEGATIVE mg/dL
Ketones, ur: 15 mg/dL — AB
Nitrite: NEGATIVE
Protein, ur: 100 mg/dL — AB
Specific Gravity, Urine: >1.03 — ABNORMAL HIGH (ref 1.005–1.030)
pH: 6 (ref 5.0–8.0)

## 2018-08-21 LAB — URINALYSIS, MICROSCOPIC (REFLEX)
RBC / HPF: >50 RBC/hpf (ref 0–5)
WBC, UA: >50 WBC/hpf (ref 0–5)

## 2018-08-21 LAB — PREGNANCY, URINE: Preg Test, Ur: NEGATIVE

## 2018-08-21 MED ORDER — CEPHALEXIN 250 MG PO CAPS
500.0000 mg | ORAL_CAPSULE | Freq: Once | ORAL | Status: AC
Start: 2018-08-21 — End: 2018-08-21
  Administered 2018-08-21: 500 mg via ORAL
  Filled 2018-08-21: qty 2

## 2018-08-21 MED ORDER — CEPHALEXIN 500 MG PO CAPS: 500.0000 mg | ORAL_CAPSULE | Freq: Three times a day (TID) | ORAL | 0 refills | Status: AC

## 2018-08-21 NOTE — ED Triage Notes (Signed)
Pt reports painful urination, hematuria and frequency that started 2 hours ago.

## 2018-08-21 NOTE — ED Provider Notes (Signed)
[facility] EMERGENCY DEPARTMENT Provider Note  CSN: [id number] Arrival date & time: 08/21/18 0354  Chief Complaint(s) Dysuria  HPI Heather Hines is a 33 y.o. female   The history is provided by the patient.  Dysuria  Pain severity:  Moderate Onset quality:  Gradual Duration:  2 hours Timing:  Constant Progression:  Unchanged Chronicity:  New Relieved by:  Nothing Worsened by:  Nothing Urinary symptoms: frequent urination and hematuria   Associated symptoms: abdominal pain   Associated symptoms: no fever, no flank pain and no vaginal discharge   Abdominal pain:    Location:  Suprapubic   Quality: aching     Severity:  Mild Risk factors: sexually active (several hours ago)     Past Medical History Past Medical History:  Diagnosis Date  . ADHD   . Bipolar depression (HCC)   . GAD (generalized anxiety disorder)   . Substance abuse in remission (HCC)    There are no active problems to display for this patient.  Home Medication(s) Prior to Admission medications   Medication Sig Start Date End Date Taking? Authorizing Provider  lisdexamfetamine (VYVANSE) 50 MG capsule Take 50 mg by mouth daily.   Yes [provider]  propranolol (INDERAL) 20 MG tablet Take by mouth. 01/30/18  Yes [provider]  sertraline (ZOLOFT) 100 MG tablet Take 100 mg by mouth daily.   Yes [provider]  busPIRone (BUSPAR) 5 MG tablet Take one tablet (5mg ) in the morning and one tablet (5mg ) in the afternoon, and take 2 tablets (10 mg) at bedtime. 01/30/18   [provider]  cephALEXin (KEFLEX) 500 MG capsule Take 1 capsule (500 mg total) by mouth 3 (three) times daily for 5 days. 08/21/18 08/26/18  Nira Connardama, Riccardo Holeman Eduardo, MD                                                                                                                                    Past Surgical History History reviewed. No pertinent surgical history. Family History No family  history on file.  Social History Social History   Tobacco Use  . Smoking status: Current Every Day Smoker  Substance Use Topics  . Alcohol use: Not on file  . Drug use: Not on file   Allergies Biaxin [clarithromycin]  Review of Systems Review of Systems  Constitutional: Negative for fever.  Gastrointestinal: Positive for abdominal pain.  Genitourinary: Positive for dysuria. Negative for flank pain and vaginal discharge.   All other systems are reviewed and are negative for acute change except as noted in the HPI  Physical Exam Vital Signs  I have reviewed the triage vital signs BP 130/84 (BP Location: Left Arm)   Pulse (!) 109   Temp 98.2 F (36.8 C) (Oral)   Resp 18   Ht 5' 5.5" (1.664 m)   Wt 67.1 kg   LMP 07/27/2018 (Exact Date)   SpO2 100%  BMI 24.25 kg/m   Physical Exam Vitals signs reviewed.  Constitutional:      General: She is not in acute distress.    Appearance: She is well-developed. She is not diaphoretic.  HENT:     Head: Normocephalic and atraumatic.     Right Ear: External ear normal.     Left Ear: External ear normal.     Nose: Nose normal.  Eyes:     General: No scleral icterus.    Conjunctiva/sclera: Conjunctivae normal.  Neck:     Musculoskeletal: Normal range of motion.     Trachea: Phonation normal.  Cardiovascular:     Rate and Rhythm: Normal rate and regular rhythm.  Pulmonary:     Effort: Pulmonary effort is normal. No respiratory distress.     Breath sounds: No stridor.  Abdominal:     General: There is no distension.     Tenderness: There is abdominal tenderness (mild discomfort) in the suprapubic area. There is no guarding or rebound.  Musculoskeletal: Normal range of motion.  Neurological:     Mental Status: She is alert and oriented to person, place, and time.  Psychiatric:        Behavior: Behavior normal.     ED Results and Treatments Labs (all labs ordered are listed, but only abnormal results are displayed) Labs  Reviewed  URINALYSIS, ROUTINE W REFLEX MICROSCOPIC - Abnormal; Notable for the following components:      Result Value   APPearance CLOUDY (*)    Specific Gravity, Urine >1.030 (*)    Hgb urine dipstick LARGE (*)    Bilirubin Urine SMALL (*)    Ketones, ur 15 (*)    Protein, ur 100 (*)    Leukocytes,Ua MODERATE (*)    All other components within normal limits  URINALYSIS, MICROSCOPIC (REFLEX) - Abnormal; Notable for the following components:   Bacteria, UA MANY (*)    All other components within normal limits  URINE CULTURE  PREGNANCY, URINE                                                                                                                         EKG  EKG Interpretation  Date/Time:    Ventricular Rate:    PR Interval:    QRS Duration:   QT Interval:    QTC Calculation:   R Axis:     Text Interpretation:        Radiology No results found. Pertinent labs & imaging results that were available during my care of the patient were reviewed by me and considered in my medical decision making (see chart for details).  Medications Ordered in ED Medications  cephALEXin (KEFLEX) capsule 500 mg (500 mg Oral Given 08/21/18 0439)  Procedures Procedures  (including critical care time)  Medical Decision Making / ED Course I have reviewed the nursing notes for this encounter and the patient's prior records (if available in EHR or on provided paperwork).    Consistent with UTI. UPT negative. Patient declined pelvic as she is scheduled to see her Gynecologist next week.  Keflex given.     Final Clinical Impression(s) / ED Diagnoses Final diagnoses:  Lower urinary tract infectious disease   Disposition: Discharge  Condition: Good  I have discussed the results, Dx and Tx plan with the patient who expressed understanding and agree(s) with  the plan. Discharge instructions discussed at great length. The patient was given strict return precautions who verbalized understanding of the instructions. No further questions at time of discharge.    ED Discharge Orders         Ordered    cephALEXin (KEFLEX) 500 MG capsule  3 times daily     08/21/18 0445             This chart was dictated using voice recognition software.  Despite best efforts to proofread,  errors can occur which can change the documentation meaning.   Nira Connardama, Kelii Chittum Eduardo, MD 08/21/18 [phone]

## 2018-08-23 LAB — URINE CULTURE

## 2018-08-24 ENCOUNTER — Telehealth: Payer: Self-pay | Admitting: Emergency Medicine

## 2018-08-24 NOTE — Telephone Encounter (Signed)
Post ED Visit - Positive Culture Follow-up  Culture report reviewed by antimicrobial stewardship pharmacist:  []  Enzo Bi, Pharm.D. []  Celedonio Miyamoto, Pharm.D., BCPS AQ-ID []  Garvin Fila, Pharm.D., BCPS []  Georgina Pillion, [address].D., BCPS []  [place], [address].D., BCPS, AAHIVP []  Estella Husk, Pharm.D., BCPS, AAHIVP []  Lysle Pearl, PharmD, BCPS []  Phillips Climes, PharmD, BCPS []  Agapito Games, PharmD, BCPS []  Verlan Friends, PharmD Link Snuffer PharmD  Positive urine culture Treated with cephalexin, organism sensitive to the same and no further patient follow-up is required at this time.  Berle Mull 08/24/2018, 12:08 PM

## 2018-08-28 ENCOUNTER — Other Ambulatory Visit: Payer: Self-pay

## 2018-08-28 ENCOUNTER — Emergency Department (HOSPITAL_BASED_OUTPATIENT_CLINIC_OR_DEPARTMENT_OTHER): Payer: 59

## 2018-08-28 ENCOUNTER — Emergency Department (HOSPITAL_BASED_OUTPATIENT_CLINIC_OR_DEPARTMENT_OTHER)
Admission: EM | Admit: 2018-08-28 | Discharge: 2018-08-28 | Disposition: A | Discharge status: Home / Self Care | Payer: 59 | Attending: Emergency Medicine | Admitting: Emergency Medicine

## 2018-08-28 ENCOUNTER — Encounter (HOSPITAL_BASED_OUTPATIENT_CLINIC_OR_DEPARTMENT_OTHER): Payer: Self-pay | Admitting: Emergency Medicine

## 2018-08-28 DIAGNOSIS — R0602 Shortness of breath: Secondary | ICD-10-CM | POA: Diagnosis present

## 2018-08-28 DIAGNOSIS — R05 Cough: Secondary | ICD-10-CM | POA: Diagnosis not present

## 2018-08-28 DIAGNOSIS — R072 Precordial pain: Secondary | ICD-10-CM | POA: Diagnosis not present

## 2018-08-28 DIAGNOSIS — F17200 Nicotine dependence, unspecified, uncomplicated: Secondary | ICD-10-CM | POA: Diagnosis not present

## 2018-08-28 DIAGNOSIS — R6889 Other general symptoms and signs: Secondary | ICD-10-CM

## 2018-08-28 LAB — CBC WITH DIFFERENTIAL/PLATELET
Abs Immature Granulocytes: 0.03 10*3/uL (ref 0.00–0.07)
Basophils Absolute: 0 10*3/uL (ref 0.0–0.1)
Basophils Relative: 0 %
EOS PCT: 0 %
Eosinophils Absolute: 0 10*3/uL (ref 0.0–0.5)
HEMATOCRIT: 39.6 % (ref 36.0–46.0)
Hemoglobin: 12.6 g/dL (ref 12.0–15.0)
Immature Granulocytes: 0 %
LYMPHS PCT: 8 %
Lymphs Abs: 1.1 10*3/uL (ref 0.7–4.0)
MCH: 32.6 pg (ref 26.0–34.0)
MCHC: 31.8 g/dL (ref 30.0–36.0)
MCV: 102.6 fL — ABNORMAL HIGH (ref 80.0–100.0)
Monocytes Absolute: 0.8 10*3/uL (ref 0.1–1.0)
Monocytes Relative: 7 %
Neutro Abs: 10.7 10*3/uL — ABNORMAL HIGH (ref 1.7–7.7)
Neutrophils Relative %: 85 %
Platelets: 191 10*3/uL (ref 150–400)
RBC: 3.86 MIL/uL — AB (ref 3.87–5.11)
RDW: 12.6 % (ref 11.5–15.5)
WBC: 12.6 10*3/uL — AB (ref 4.0–10.5)
nRBC: 0 % (ref 0.0–0.2)

## 2018-08-28 LAB — COMPREHENSIVE METABOLIC PANEL
ALT: 22 U/L (ref 0–44)
ANION GAP: 7 (ref 5–15)
AST: 28 U/L (ref 15–41)
Albumin: 3.6 g/dL (ref 3.5–5.0)
Alkaline Phosphatase: 64 U/L (ref 38–126)
BUN: 12 mg/dL (ref 6–20)
CO2: 20 mmol/L — AB (ref 22–32)
Calcium: 8.3 mg/dL — ABNORMAL LOW (ref 8.9–10.3)
Chloride: 110 mmol/L (ref 98–111)
Creatinine, Ser: 0.79 mg/dL (ref 0.44–1.00)
GFR calc Af Amer: >60 mL/min (ref >60)
GFR calc non Af Amer: >60 mL/min (ref >60)
Glucose, Bld: 102 mg/dL — ABNORMAL HIGH (ref 70–99)
POTASSIUM: 3.7 mmol/L (ref 3.5–5.1)
Sodium: 137 mmol/L (ref 135–145)
TOTAL PROTEIN: 6.6 g/dL (ref 6.5–8.1)
Total Bilirubin: 0.6 mg/dL (ref 0.3–1.2)

## 2018-08-28 LAB — TROPONIN I: Troponin I: <0.03 ng/mL (ref <0.03)

## 2018-08-28 LAB — BRAIN NATRIURETIC PEPTIDE: B Natriuretic Peptide: 150.9 pg/mL — ABNORMAL HIGH (ref 0.0–100.0)

## 2018-08-28 LAB — PREGNANCY, URINE: PREG TEST UR: NEGATIVE

## 2018-08-28 LAB — D-DIMER, QUANTITATIVE: D-Dimer, Quant: 0.52 ug{FEU}/mL — ABNORMAL HIGH (ref 0.00–0.50)

## 2018-08-28 MED ORDER — IOPAMIDOL (ISOVUE-370) INJECTION 76%
100.0000 mL | Freq: Once | INTRAVENOUS | Status: AC | PRN
  Administered 2018-08-28: 100 mL via INTRAVENOUS

## 2018-08-28 MED ORDER — SODIUM CHLORIDE 0.9 % IV BOLUS
1000.0000 mL | Freq: Once | INTRAVENOUS | Status: AC
Start: 2018-08-28 — End: 2018-08-28
  Administered 2018-08-28: 1000 mL via INTRAVENOUS

## 2018-08-28 MED ORDER — ALBUTEROL SULFATE HFA 108 (90 BASE) MCG/ACT IN AERS
2.0000 | INHALATION_SPRAY | Freq: Once | RESPIRATORY_TRACT | Status: AC
  Administered 2018-08-28: 2 via RESPIRATORY_TRACT
  Filled 2018-08-28: qty 6.7

## 2018-08-28 MED ORDER — IPRATROPIUM-ALBUTEROL 0.5-2.5 (3) MG/3ML IN SOLN
3.0000 mL | Freq: Four times a day (QID) | RESPIRATORY_TRACT | Status: DC
  Administered 2018-08-28: 3 mL via RESPIRATORY_TRACT
  Filled 2018-08-28: qty 3

## 2018-08-28 NOTE — ED Notes (Signed)
Patient transported to CT 

## 2018-08-28 NOTE — ED Provider Notes (Signed)
Emergency Department Provider Note   I have reviewed the triage vital signs and the nursing notes.   HISTORY  Chief Complaint Shortness of Breath   HPI Heather Hines is a 33 y.o. female presents to the emergency department with 2 days of cough, congestion, chest tightness.  Patient was reportedly diagnosed yesterday with flu and clinically diagnosed with pneumonia.  No imaging at that time.  Patient was started on doxycycline which she has been taking for the past 24 hours but continues to feel worsening shortness of breath, chest tightness.  She has also been taking Tamiflu and was reportedly diagnosed with flu a.  No vomiting or diarrhea.  Chest pain is diffuse and non-radiating.  Patient notes a productive cough.  Denies hemoptysis.  Past Medical History:  Diagnosis Date  . ADHD   . Bipolar depression (HCC)   . GAD (generalized anxiety disorder)   . Substance abuse in remission (HCC)     There are no active problems to display for this patient.   History reviewed. No pertinent surgical history.  Allergies Biaxin [clarithromycin]  No family history on file.  Social History Social History   Tobacco Use  . Smoking status: Current Every Day Smoker  . Smokeless tobacco: Never Used  Substance Use Topics  . Alcohol use: Not on file  . Drug use: Not on file    Review of Systems  Constitutional: Positive fever/chills Eyes: No visual changes. ENT: No sore throat. Cardiovascular: Positive chest pain. Respiratory: Positive shortness of breath and cough.  Gastrointestinal: No abdominal pain.  No nausea, no vomiting.  No diarrhea.  No constipation. Genitourinary: Negative for dysuria. Musculoskeletal: Negative for back pain. Skin: Negative for rash. Neurological: Negative for headaches, focal weakness or numbness.  10-point ROS otherwise negative.  ____________________________________________   PHYSICAL EXAM:  VITAL SIGNS: ED Triage Vitals  Enc Vitals  Group     BP 08/28/18 1127 120/80     Pulse Rate 08/28/18 1127 (!) 56     Resp 08/28/18 1127 16     Temp 08/28/18 1127 98.2 F (36.8 C)     Temp src --      SpO2 08/28/18 1127 100 %     Weight 08/28/18 1127 146 lb (66.2 kg)     Height 08/28/18 1127 5' 5.5" (1.664 m)   Constitutional: Alert and oriented. Well appearing and in no acute distress. Eyes: Conjunctivae are normal.  Head: Atraumatic. Nose: No congestion/rhinnorhea. Mouth/Throat: Mucous membranes are moist.  Oropharynx non-erythematous. Neck: No stridor.  Cardiovascular: Normal rate, regular rhythm. Good peripheral circulation. Grossly normal heart sounds.   Respiratory: Normal respiratory effort.  No retractions. Lungs CTAB. Gastrointestinal: Soft and nontender. No distention.  Musculoskeletal: No lower extremity tenderness nor edema. No gross deformities of extremities. Neurologic:  Normal speech and language. No gross focal neurologic deficits are appreciated.  Skin:  Skin is warm, dry and intact. No rash noted.  ____________________________________________   LABS (all labs ordered are listed, but only abnormal results are displayed)  Labs Reviewed  COMPREHENSIVE METABOLIC PANEL - Abnormal; Notable for the following components:      Result Value   CO2 20 (*)    Glucose, Bld 102 (*)    Calcium 8.3 (*)    All other components within normal limits  BRAIN NATRIURETIC PEPTIDE - Abnormal; Notable for the following components:   B Natriuretic Peptide 150.9 (*)    All other components within normal limits  CBC WITH DIFFERENTIAL/PLATELET - Abnormal; Notable for the  following components:   WBC 12.6 (*)    RBC 3.86 (*)    MCV 102.6 (*)    Neutro Abs 10.7 (*)    All other components within normal limits  D-DIMER, QUANTITATIVE (NOT AT [facility]) - Abnormal; Notable for the following components:   D-Dimer, Quant 0.52 (*)    All other components within normal limits  TROPONIN I  PREGNANCY, URINE    ____________________________________________  EKG   EKG Interpretation  Date/Time:  Thursday August 28 2018 12:27:42 EST Ventricular Rate:  50 PR Interval:    QRS Duration: 79 QT Interval:  474 QTC Calculation: 433 R Axis:   87 Text Interpretation:  Sinus rhythm Short PR interval No STEMI  Confirmed by Alona Bene [phone]) on 08/28/2018 1:26:40 PM       ____________________________________________  RADIOLOGY  Dg Chest 2 View  Result Date: 08/28/2018 CLINICAL DATA:  Chest pain, fever and cough for 1 week. EXAM: CHEST - 2 VIEW COMPARISON:  08/08/2008 FINDINGS: Cardiomediastinal silhouette is normal. Mediastinal contours appear intact. There is no evidence of focal airspace consolidation, pleural effusion or pneumothorax. Osseous structures are without acute abnormality. Soft tissues are grossly normal. IMPRESSION: No active cardiopulmonary disease. Electronically Signed   By: Ted Mcalpine M.D.   On: 08/28/2018 12:32   Ct Angio Chest Pe W And/or Wo Contrast  Result Date: 08/28/2018 CLINICAL DATA:  Shortness of breath. Recent low diagnosis. EXAM: CT ANGIOGRAPHY CHEST WITH CONTRAST TECHNIQUE: Multidetector CT imaging of the chest was performed using the standard protocol during bolus administration of intravenous contrast. Multiplanar CT image reconstructions and MIPs were obtained to evaluate the vascular anatomy. CONTRAST:  ISOVUE-370 IOPAMIDOL (ISOVUE-370) INJECTION 76% COMPARISON:  Chest radiograph 08/28/2018 FINDINGS: Cardiovascular: Satisfactory opacification of the pulmonary arteries to the segmental level. No evidence of pulmonary embolism. Normal heart size. No pericardial effusion. Mediastinum/Nodes: No enlarged mediastinal, hilar, or axillary lymph nodes. Thyroid gland, trachea, and esophagus demonstrate no significant findings. Lungs/Pleura: Lungs are clear. No pleural effusion or pneumothorax. Upper Abdomen: No acute abnormality. Musculoskeletal: No chest wall  abnormality. No acute or significant osseous findings. Review of the MIP images confirms the above findings. IMPRESSION: No evidence of pulmonary embolus. No acute abnormality seen in the chest. Electronically Signed   By: Ted Mcalpine M.D.   On: 08/28/2018 14:11    ____________________________________________   PROCEDURES  Procedure(s) performed:   Procedures  None ____________________________________________   INITIAL IMPRESSION / ASSESSMENT AND PLAN / ED COURSE  Pertinent labs & imaging results that were available during my care of the patient were reviewed by me and considered in my medical decision making (see chart for details).  Patient presents to the emergency department with chest tightness and shortness of breath in the setting of flu diagnosis.  She was empirically started on doxycycline.  Patient with no hypoxemia or tachycardia on exam.  She does have a normal chest x-ray.  Patient's BNP was elevated.  Troponin normal.  Pregnancy negative.  D-dimer was obtained given the pleuritic nature of this discomfort and risk elevated secondary to smoking.  CT shows no evidence of PE or occult pneumonia.  I advised the patient on the importance and benefits of smoking cessation especially in the setting of dealing with respiratory infections.  Patient is trying to quit.   Patient can discontinue the doxycycline but will continue the Tamiflu and other supportive care.  Inhaler provided in the emergency department.  Discussed ED return precautions in detail.   ____________________________________________  FINAL CLINICAL  IMPRESSION(S) / ED DIAGNOSES  Final diagnoses:  Precordial chest pain  Flu-like symptoms     MEDICATIONS GIVEN DURING THIS VISIT:  Medications  sodium chloride 0.9 % bolus 1,000 mL (0 mLs Intravenous Stopped 08/28/18 1350)  iopamidol (ISOVUE-370) 76 % injection 100 mL (100 mLs Intravenous Contrast Given 08/28/18 1351)  albuterol (PROVENTIL HFA;VENTOLIN  HFA) 108 (90 Base) MCG/ACT inhaler 2 puff (2 puffs Inhalation Given 08/28/18 1452)    Note:  This document was prepared using Dragon voice recognition software and may include unintentional dictation errors.  Alona BeneJoshua Kateleen Encarnacion, MD Emergency Medicine    Alleah Dearman, Arlyss RepressJoshua G, MD 08/28/18 [phone]

## 2018-08-28 NOTE — Discharge Instructions (Signed)

## 2018-08-28 NOTE — ED Notes (Signed)
Patient transported to X-ray 

## 2018-08-28 NOTE — ED Triage Notes (Signed)
Diagnosed with flu and pneumonia yesterday. States she continues to be SOB

## 2019-04-15 ENCOUNTER — Other Ambulatory Visit: Payer: Self-pay

## 2019-04-15 ENCOUNTER — Encounter (HOSPITAL_COMMUNITY): Payer: Self-pay

## 2019-04-15 ENCOUNTER — Inpatient Hospital Stay (HOSPITAL_COMMUNITY)
Admission: AD | Admit: 2019-04-15 | Discharge: 2019-04-15 | Disposition: A | Discharge status: Home / Self Care | Payer: Medicaid Other | Attending: Obstetrics and Gynecology | Admitting: Obstetrics and Gynecology

## 2019-04-15 DIAGNOSIS — Z79899 Other long term (current) drug therapy: Secondary | ICD-10-CM | POA: Insufficient documentation

## 2019-04-15 DIAGNOSIS — O4703 False labor before 37 completed weeks of gestation, third trimester: Secondary | ICD-10-CM | POA: Insufficient documentation

## 2019-04-15 DIAGNOSIS — Z0371 Encounter for suspected problem with amniotic cavity and membrane ruled out: Secondary | ICD-10-CM | POA: Diagnosis not present

## 2019-04-15 DIAGNOSIS — O99333 Smoking (tobacco) complicating pregnancy, third trimester: Secondary | ICD-10-CM | POA: Diagnosis not present

## 2019-04-15 DIAGNOSIS — F909 Attention-deficit hyperactivity disorder, unspecified type: Secondary | ICD-10-CM | POA: Insufficient documentation

## 2019-04-15 DIAGNOSIS — F319 Bipolar disorder, unspecified: Secondary | ICD-10-CM | POA: Insufficient documentation

## 2019-04-15 DIAGNOSIS — Z3A33 33 weeks gestation of pregnancy: Secondary | ICD-10-CM | POA: Insufficient documentation

## 2019-04-15 DIAGNOSIS — F17200 Nicotine dependence, unspecified, uncomplicated: Secondary | ICD-10-CM | POA: Diagnosis not present

## 2019-04-15 DIAGNOSIS — O99343 Other mental disorders complicating pregnancy, third trimester: Secondary | ICD-10-CM | POA: Insufficient documentation

## 2019-04-15 LAB — POCT FERN TEST: POCT Fern Test: NEGATIVE

## 2019-04-15 LAB — AMNISURE RUPTURE OF MEMBRANE (ROM) NOT AT ARMC: Amnisure ROM: NEGATIVE

## 2019-04-15 NOTE — MAU Provider Note (Signed)
Chief Complaint:  Rupture of Membranes   First Provider Initiated Contact with Patient 04/15/19 1757     HPI: Heather Hines is a 33 y.o. G1P0 at [redacted]w[redacted]d who presents to maternity admissions reporting leaking of fluid. States she "lost her mucous plug on Monday. Then she started having intermittent trickle of fluid. States she called the office today and they told her to cough while standing over a towel; states she had a large gush when she did this. Also reports intermittent contractions that are regular for 2-3 hrs at a time, then stop. No vaginal bleeding, dysuria, fever/chills, vaginal irritation, or recent intercourse. Good fetal movement.     Past Medical History:  Diagnosis Date  . Abnormal Pap smear of vagina 2017 and 2019  . ADHD   . Bipolar depression ([place])   . GAD (generalized anxiety disorder)   . Substance abuse in remission ([place])    OB History  Gravida Para Term Preterm AB Living  1            SAB TAB Ectopic Multiple Live Births               # Outcome Date GA Lbr Len/2nd Weight Sex Delivery Anes PTL Lv  1 Current            Past Surgical History:  Procedure Laterality Date  . LEEP  2015   History reviewed. No pertinent family history. Social History   Tobacco Use  . Smoking status: Current Every Day Smoker  . Smokeless tobacco: Never Used  Substance Use Topics  . Alcohol use: Never    Frequency: Never  . Drug use: Never   Allergies  Allergen Reactions  . Biaxin [Clarithromycin]    Medications Prior to Admission  Medication Sig Dispense Refill Last Dose  . acetaminophen (TYLENOL) 325 MG tablet Take 650 mg by mouth every 6 (six) hours as needed.   04/14/2019 at Unknown time  . cyclobenzaprine (FLEXERIL) 5 MG tablet Take 5 mg by mouth 3 (three) times daily as needed for muscle spasms.   04/14/2019 at Unknown time  . diphenhydrAMINE (BENADRYL) 25 mg capsule Take 25 mg by mouth every 6 (six) hours as needed.   04/14/2019 at Unknown time  . lisdexamfetamine  (VYVANSE) 50 MG capsule Take 20 mg by mouth daily.    04/15/2019 at Unknown time  . sertraline (ZOLOFT) 100 MG tablet Take 100 mg by mouth daily.   04/15/2019 at Unknown time  . busPIRone (BUSPAR) 5 MG tablet Take one tablet (5mg ) in the morning and one tablet (5mg ) in the afternoon, and take 2 tablets (10 mg) at bedtime.     . propranolol (INDERAL) 20 MG tablet Take by mouth.       I have reviewed patient's Past Medical Hx, Surgical Hx, Family Hx, Social Hx, medications and allergies.   ROS:  Review of Systems  Constitutional: Negative.   Gastrointestinal: Positive for abdominal pain (not currently). Negative for constipation, diarrhea, nausea and vomiting.  Genitourinary: Positive for vaginal discharge. Negative for dysuria and vaginal bleeding.    Physical Exam   Patient Vitals for the past 24 hrs:  BP Temp Temp src Pulse Resp SpO2  04/15/19 1755 131/68 98.7 F (37.1 C) Oral 95 12 97 %  04/15/19 1736 133/77 - - - - -    Constitutional: Well-developed, well-nourished female in no acute distress.  Cardiovascular: normal rate & rhythm, no murmur Respiratory: normal effort, lung sounds clear throughout GI: Abd soft, non-tender, gravid  appropriate for gestational age. Pos BS x 4 MS: Extremities nontender, no edema, normal ROM Neurologic: Alert and oriented x 4.  GU:      Pelvic: NEFG, physiologic discharge, no blood, cervix clean. No pooling. Cervix visually closed.   NST:  Baseline: 130 bpm, Variability: Good {> 6 bpm), Accelerations: Reactive and Decelerations: Absent   Labs: Results for orders placed or performed during the hospital encounter of 04/15/19 (from the past 24 hour(s))  POCT fern test     Status: None   Collection Time: 04/15/19  6:18 PM  Result Value Ref Range   POCT Fern Test Negative = intact amniotic membranes   Amnisure rupture of membrane (rom)not at [facility]     Status: None   Collection Time: 04/15/19  6:45 PM  Result Value Ref Range   Amnisure ROM NEGATIVE      Imaging:  No results found.  MAU Course: Orders Placed This Encounter  Procedures  . Urinalysis, Routine w reflex microscopic  . Amnisure rupture of membrane (rom)not at [facility]  . POCT fern test  . Discharge patient   No orders of the defined types were placed in this encounter.   MDM: Reactive fetal tracing with irregular contractions. Cervix closed. No pooling on exam. Fern & amnisure negative.  Assessment: 1. Encounter for suspected PROM, with rupture of membranes not found   2. [redacted] weeks gestation of pregnancy     Plan: Discharge home in stable condition.  Preterm Labor precautions and fetal kick counts Follow-up Information    [place] Follow up.   Why: keep scheduled appointment or call office as needed  Contact information: [address] [place] [address] [phone]        [facility] Follow up.   Specialty: Obstetrics and Gynecology Why: return for worsening symptoms Contact information: [address] [id number] Wilhemina Bonito [place] [address] [phone]          Allergies as of 04/15/2019      Reactions   Biaxin [clarithromycin]       Medication List    STOP taking these medications   busPIRone 5 MG tablet Commonly known as: BUSPAR   propranolol 20 MG tablet Commonly known as: INDERAL     TAKE these medications   acetaminophen 325 MG tablet Commonly known as: TYLENOL Take 650 mg by mouth every 6 (six) hours as needed.   cyclobenzaprine 5 MG tablet Commonly known as: FLEXERIL Take 5 mg by mouth 3 (three) times daily as needed for muscle spasms.   diphenhydrAMINE 25 mg capsule Commonly known as: BENADRYL Take 25 mg by mouth every 6 (six) hours as needed.   lisdexamfetamine 50 MG capsule Commonly known as: VYVANSE Take 20 mg by mouth daily.   sertraline 100 MG tablet Commonly known as: ZOLOFT Take 100 mg by mouth daily.       Judeth Horn,  NP 04/15/2019 7:17 PM

## 2019-04-15 NOTE — MAU Note (Signed)
Pt reporting small amounts of clear fluid since Monday.  Had lost mucous plug earlier.  Pt has had a irregular contractions. Denies VB. +FM

## 2019-04-15 NOTE — Discharge Instructions (Signed)
Warning Signs During Pregnancy °A pregnancy lasts about 40 weeks, starting from the first day of your last period until the baby is born. Pregnancy is divided into three phases called trimesters. °· The first trimester refers to week 1 through week 13 of pregnancy. °· The second trimester is the start of week 14 through the end of week 27. °· The third trimester is the start of week 28 until you deliver your baby. °During each trimester of pregnancy, certain signs and symptoms may indicate a problem. Talk with your health care provider about your current health and any medical conditions you have. Make sure you know the symptoms that you should watch for and report. °How does this affect me? ° °Warning signs in the first trimester °While some changes during the first trimester may be uncomfortable, most do not represent a serious problem. Let your health care provider know if you have any of the following warning signs in the first trimester: °· You cannot eat or drink without vomiting, and this lasts for longer than a day. °· You have vaginal bleeding or spotting along with menstrual-like cramping. °· You have diarrhea for longer than a day. °· You have a fever or other signs of infection, such as: °? Pain or burning when you urinate. °? Foul smelling or thick or yellowish vaginal discharge. °Warning signs in the second trimester °As your baby grows and changes during the second trimester, there are additional signs and symptoms that may indicate a problem. These include: °· Signs and symptoms of infection, including a fever. °· Signs or symptoms of a miscarriage or preterm labor, such as regular contractions, menstrual-like cramping, or lower abdominal pain. °· Bloody or watery vaginal discharge or obvious vaginal bleeding. °· Feeling like your heart is pounding. °· Having trouble breathing. °· Nausea, vomiting, or diarrhea that lasts for longer than a day. °· Craving non-food items, such as clay, chalk, or dirt.  This may be a sign of a very treatable medical condition called pica. °Later in your second trimester, watch for signs and symptoms of a serious medical condition called preeclampsia.These include: °· Changes in your vision. °· A severe headache that does not go away. °· Nausea and vomiting. °It is also important to notice if your baby stops moving or moves less than usual during this time. °Warning signs in the third trimester °As you approach the third trimester, your baby is growing and your body is preparing for the birth of your baby. In your third trimester, be sure to let your health care provider know if: °· You have signs and symptoms of infection, including a fever. °· You have vaginal bleeding. °· You notice that your baby is moving less than usual or is not moving. °· You have nausea, vomiting, or diarrhea that lasts for longer than a day. °· You have a severe headache that does not go away. °· You have vision changes, including seeing spots or having blurry or double vision. °· You have increased swelling in your hands or face. °How does this affect my baby? °Throughout your pregnancy, always report any of the warning signs of a problem to your health care provider. This can help prevent complications that may affect your baby, including: °· Increased risk for premature birth. °· Infection that may be transmitted to your baby. °· Increased risk for stillbirth. °Contact a health care provider if: °· You have any of the warning signs of a problem for the current trimester of your pregnancy. °·   Any of the following apply to you during any trimester of pregnancy: ? You have strong emotions, such as sadness or anxiety, that interfere with work or personal relationships. ? You feel unsafe in your home and need help finding a safe place to live. ? You are using tobacco products, alcohol, or drugs and you need help to stop. Get help right away if: You have signs or symptoms of labor before 37 weeks of  pregnancy. These include:  Contractions that are 5 minutes or less apart, or that increase in frequency, intensity, or length.  Sudden, sharp abdominal pain or low back pain.  Uncontrolled gush or trickle of fluid from your vagina. Summary  A pregnancy lasts about 40 weeks, starting from the first day of your last period until the baby is born. Pregnancy is divided into three phases called trimesters. Each trimester has warning signs to watch for.  Always report any warning signs to your health care provider in order to prevent complications that may affect both you and your baby.  Talk with your health care provider about your current health and any medical conditions you have. Make sure you know the symptoms that you should watch for and report. This information is not intended to replace advice given to you by your health care provider. Make sure you discuss any questions you have with your health care provider. Document Released: 04/11/2017 Document Revised: 10/14/2018 Document Reviewed: 04/11/2017 Elsevier Patient Education  2020 Else[employer] Fetal Movement Counts Patient Name: ________________________________________________ Patient Due Date: ____________________ What is a fetal movement count?  A fetal movement count is the number of times that you feel your baby move during a certain amount of time. This may also be called a fetal kick count. A fetal movement count is recommended for every pregnant woman. You may be asked to start counting fetal movements as early as week 28 of your pregnancy. Pay attention to when your baby is most active. You may notice your baby's sleep and wake cycles. You may also notice things that make your baby move more. You should do a fetal movement count:  When your baby is normally most active.  At the same time each day. A good time to count movements is while you are resting, after having something to eat and drink. How do I count fetal  movements? 1. Find a quiet, comfortable area. Sit, or lie down on your side. 2. Write down the date, the start time and stop time, and the number of movements that you felt between those two times. Take this information with you to your health care visits. 3. For 2 hours, count kicks, flutters, swishes, rolls, and jabs. You should feel at least 10 movements during 2 hours. 4. You may stop counting after you have felt 10 movements. 5. If you do not feel 10 movements in 2 hours, have something to eat and drink. Then, keep resting and counting for 1 hour. If you feel at least 4 movements during that hour, you may stop counting. Contact a health care provider if:  You feel fewer than 4 movements in 2 hours.  Your baby is not moving like he or she usually does. Date: ____________ Start time: ____________ Stop time: ____________ Movements: ____________ Date: ____________ Start time: ____________ Stop time: ____________ Movements: ____________ Date: ____________ Start time: ____________ Stop time: ____________ Movements: ____________ Date: ____________ Start time: ____________ Stop time: ____________ Movements: ____________ Date: ____________ Start time: ____________ Stop time: ____________ Movements: ____________ Date: ____________  Start time: ____________ Stop time: ____________ Movements: ____________ Date: ____________ Start time: ____________ Stop time: ____________ Movements: ____________ Date: ____________ Start time: ____________ Stop time: ____________ Movements: ____________ Date: ____________ Start time: ____________ Stop time: ____________ Movements: ____________ This information is not intended to replace advice given to you by your health care provider. Make sure you discuss any questions you have with your health care provider. Document Released: 07/25/2006 Document Revised: 07/15/2018 Document Reviewed: 08/04/2015 Elsevier Patient Education  2020 Elsevier Inc.  

## 2019-04-23 ENCOUNTER — Other Ambulatory Visit: Payer: Self-pay

## 2019-04-23 ENCOUNTER — Encounter (HOSPITAL_COMMUNITY): Payer: Self-pay

## 2019-04-23 ENCOUNTER — Inpatient Hospital Stay (HOSPITAL_COMMUNITY)
Admission: AD | Admit: 2019-04-23 | Discharge: 2019-04-23 | Disposition: A | Discharge status: Home / Self Care | Payer: Medicaid Other | Attending: Obstetrics and Gynecology | Admitting: Obstetrics and Gynecology

## 2019-04-23 DIAGNOSIS — O4703 False labor before 37 completed weeks of gestation, third trimester: Secondary | ICD-10-CM | POA: Diagnosis not present

## 2019-04-23 DIAGNOSIS — O99333 Smoking (tobacco) complicating pregnancy, third trimester: Secondary | ICD-10-CM | POA: Diagnosis not present

## 2019-04-23 DIAGNOSIS — F17210 Nicotine dependence, cigarettes, uncomplicated: Secondary | ICD-10-CM | POA: Insufficient documentation

## 2019-04-23 DIAGNOSIS — Z3A34 34 weeks gestation of pregnancy: Secondary | ICD-10-CM | POA: Insufficient documentation

## 2019-04-23 DIAGNOSIS — Z0371 Encounter for suspected problem with amniotic cavity and membrane ruled out: Secondary | ICD-10-CM

## 2019-04-23 LAB — URINALYSIS, ROUTINE W REFLEX MICROSCOPIC
Bilirubin Urine: NEGATIVE
Glucose, UA: NEGATIVE mg/dL
Hgb urine dipstick: NEGATIVE
Ketones, ur: NEGATIVE mg/dL
Leukocytes,Ua: NEGATIVE
Nitrite: NEGATIVE
Protein, ur: NEGATIVE mg/dL
Specific Gravity, Urine: 1.013 (ref 1.005–1.030)
pH: 6 (ref 5.0–8.0)

## 2019-04-23 LAB — POCT FERN TEST: POCT Fern Test: NEGATIVE

## 2019-04-23 NOTE — Discharge Instructions (Signed)
Reasons to return to MAU at [facility]:  Since you are preterm, return to MAU if:  1.  Contractions are 10 minutes apart or less and they becoming more uncomfortable or painful over time 2.  You have a large gush of fluid, or a trickle of fluid that will not stop and you have to wear a pad 3.  You have bleeding that is bright red, heavier than spotting--like menstrual bleeding (spotting can be normal in early labor or after a check of your cervix) 4.  You do not feel the baby moving like he/she normally does  

## 2019-04-23 NOTE — MAU Note (Signed)
.   Heather Hines is a 33 y.o. at [redacted]w[redacted]d here in MAU reporting: contractions on and off with leakage of clear fluid for a couple of weeks pt states she was evaluated in MAU Wednesday of last week to be evaluated for that.Dr Corinna Capra checked pt in the office and sent her in for labor check  LMP:  Onset of complaint: ongoing Pain score: 5 Vitals:   04/23/19 1753  BP: 123/85  Pulse: (!) 110  Resp: 16  Temp: 98.7 F (37.1 C)     FHT:144 Lab orders placed from triage: UA

## 2019-04-23 NOTE — MAU Provider Note (Signed)
Chief Complaint:  Contractions   First Provider Initiated Contact with Patient 04/23/19 1924      HPI: Heather Hines is a 33 y.o. G1P0 at [redacted]w[redacted]d who presents to maternity admissions reporting irregular but uncomfortable contractions x 2 weeks.  The cramping is associated with leaking of clear fluid, requiring pantyliners but not pads. Both cramping and leaking are unchanged since onset 2 weeks ago. She was checked in the office today and was found to be 2 cm and 80 percent effaced so was sent to MAU for further evaluation.   She reports good fetal movement.  HPI  Past Medical History: Past Medical History:  Diagnosis Date  . Abnormal Pap smear of vagina 2017 and 2019  . ADHD   . Bipolar depression ([place])   . GAD (generalized anxiety disorder)   . Substance abuse in remission [facility])     Past obstetric history: OB History  Gravida Para Term Preterm AB Living  1            SAB TAB Ectopic Multiple Live Births               # Outcome Date GA Lbr Len/2nd Weight Sex Delivery Anes PTL Lv  1 Current             Past Surgical History: Past Surgical History:  Procedure Laterality Date  . LEEP  2015    Family History: History reviewed. No pertinent family history.  Social History: Social History   Tobacco Use  . Smoking status: Current Every Day Smoker    Packs/day: 0.50    Types: Cigarettes  . Smokeless tobacco: Never Used  Substance Use Topics  . Alcohol use: Never    Frequency: Never  . Drug use: Not Currently    Allergies:  Allergies  Allergen Reactions  . Biaxin [Clarithromycin]     Meds:  Medications Prior to Admission  Medication Sig Dispense Refill Last Dose  . acetaminophen (TYLENOL) 325 MG tablet Take 650 mg by mouth every 6 (six) hours as needed.     . cyclobenzaprine (FLEXERIL) 5 MG tablet Take 5 mg by mouth 3 (three) times daily as needed for muscle spasms.     . diphenhydrAMINE (BENADRYL) 25 mg capsule Take 25 mg by mouth every 6 (six) hours as  needed.     Marland Kitchen lisdexamfetamine (VYVANSE) 50 MG capsule Take 20 mg by mouth daily.      . sertraline (ZOLOFT) 100 MG tablet Take 100 mg by mouth daily.       ROS:  Review of Systems  Constitutional: Negative for chills, fatigue and fever.  Eyes: Negative for visual disturbance.  Respiratory: Negative for shortness of breath.   Cardiovascular: Negative for chest pain.  Gastrointestinal: Positive for abdominal pain. Negative for nausea and vomiting.  Genitourinary: Positive for vaginal discharge. Negative for difficulty urinating, dysuria, flank pain, pelvic pain, vaginal bleeding and vaginal pain.  Neurological: Negative for dizziness and headaches.  Psychiatric/Behavioral: Negative.      I have reviewed patient's Past Medical Hx, Surgical Hx, Family Hx, Social Hx, medications and allergies.   Physical Exam   Patient Vitals for the past 24 hrs:  BP Temp Pulse Resp  04/23/19 1753 123/85 98.7 F (37.1 C) (!) 110 16   Constitutional: Well-developed, well-nourished female in no acute distress.  Cardiovascular: normal rate Respiratory: normal effort GI: Abd soft, non-tender, gravid appropriate for gestational age.  MS: Extremities nontender, no edema, normal ROM Neurologic: Alert and oriented x 4.  GU: Neg CVAT.  PELVIC EXAM: Cervix pink, visually closed but thin/short, without lesion, moderate milky discharge, vaginal walls and external genitalia normal   Dilation: 2 Effacement (%): 80 Cervical Position: Middle Station: -2 Presentation: Vertex Exam by:: L. Leftwich-Kirby,CNM  FHT:  Baseline 135 , moderate variability, accelerations present, no decelerations Contractions: irritability, irregular, mild to palpation   Labs: Results for orders placed or performed during the hospital encounter of 04/23/19 (from the past 24 hour(s))  Urinalysis, Routine w reflex microscopic     Status: None   Collection Time: 04/23/19  7:15 PM  Result Value Ref Range   Color, Urine YELLOW  YELLOW   APPearance CLEAR CLEAR   Specific Gravity, Urine 1.013 1.005 - 1.030   pH 6.0 5.0 - 8.0   Glucose, UA NEGATIVE NEGATIVE mg/dL   Hgb urine dipstick NEGATIVE NEGATIVE   Bilirubin Urine NEGATIVE NEGATIVE   Ketones, ur NEGATIVE NEGATIVE mg/dL   Protein, ur NEGATIVE NEGATIVE mg/dL   Nitrite NEGATIVE NEGATIVE   Leukocytes,Ua NEGATIVE NEGATIVE  Fern Test     Status: None   Collection Time: 04/23/19  8:03 PM  Result Value Ref Range   POCT Fern Test Negative = intact amniotic membranes       Imaging:  No results found.  MAU Course/MDM: Orders Placed This Encounter  Procedures  . Urinalysis, Routine w reflex microscopic  . Fern Test  . Discharge patient    No orders of the defined types were placed in this encounter.    NST reviewed and reactive Pt cervix 2/80/-2, which is unchanged from office visit 4+ hours prior to MAU exam No pooling or ferning, so no evidence of ROM today Pt given option to stay 2 hours for recheck or go home and return with worsening symptoms.  Pt prefers to go home at this time. Called Dr Elon Spanner per Dr Vance Gather request to update her about pt status D/C home with preterm labor precautions/reasons to return   Assessment: 1. Threatened preterm labor, third trimester   2. Encounter for suspected premature rupture of membranes, with rupture of membranes not found     Plan: Discharge home Labor precautions and fetal kick counts Follow-up Information    Ranae Pila, MD Follow up.   Specialty: Obstetrics and Gynecology Why: As scheduled, return to MAU with signs of labor or emergencies. Contact information: [address] [place] [address] [phone]          Allergies as of 04/23/2019      Reactions   Biaxin [clarithromycin]       Medication List    TAKE these medications   acetaminophen 325 MG tablet Commonly known as: TYLENOL Take 650 mg by mouth every 6 (six) hours as needed.   cyclobenzaprine 5 MG  tablet Commonly known as: FLEXERIL Take 5 mg by mouth 3 (three) times daily as needed for muscle spasms.   diphenhydrAMINE 25 mg capsule Commonly known as: BENADRYL Take 25 mg by mouth every 6 (six) hours as needed.   lisdexamfetamine 50 MG capsule Commonly known as: VYVANSE Take 20 mg by mouth daily.   sertraline 100 MG tablet Commonly known as: ZOLOFT Take 100 mg by mouth daily.       Sharen Counter Certified Nurse-Midwife 04/23/2019 8:29 PM

## 2019-04-30 ENCOUNTER — Inpatient Hospital Stay (HOSPITAL_COMMUNITY)
Admission: AD | Admit: 2019-04-30 | Discharge: 2019-05-03 | DRG: 807 | Disposition: A | Discharge status: Home / Self Care | Payer: Medicaid Other | Attending: Obstetrics and Gynecology | Admitting: Obstetrics and Gynecology

## 2019-04-30 ENCOUNTER — Encounter (HOSPITAL_COMMUNITY): Payer: Self-pay | Admitting: *Deleted

## 2019-04-30 ENCOUNTER — Other Ambulatory Visit: Payer: Self-pay

## 2019-04-30 ENCOUNTER — Inpatient Hospital Stay (HOSPITAL_COMMUNITY): Payer: Medicaid Other | Admitting: Anesthesiology

## 2019-04-30 DIAGNOSIS — O42913 Preterm premature rupture of membranes, unspecified as to length of time between rupture and onset of labor, third trimester: Principal | ICD-10-CM | POA: Diagnosis present

## 2019-04-30 DIAGNOSIS — O99334 Smoking (tobacco) complicating childbirth: Secondary | ICD-10-CM | POA: Diagnosis present

## 2019-04-30 DIAGNOSIS — F17210 Nicotine dependence, cigarettes, uncomplicated: Secondary | ICD-10-CM | POA: Diagnosis present

## 2019-04-30 DIAGNOSIS — Z20828 Contact with and (suspected) exposure to other viral communicable diseases: Secondary | ICD-10-CM | POA: Diagnosis present

## 2019-04-30 DIAGNOSIS — Z3A35 35 weeks gestation of pregnancy: Secondary | ICD-10-CM | POA: Diagnosis not present

## 2019-04-30 LAB — CBC
HCT: 35.9 % — ABNORMAL LOW (ref 36.0–46.0)
Hemoglobin: 12.6 g/dL (ref 12.0–15.0)
MCH: 32.8 pg (ref 26.0–34.0)
MCHC: 35.1 g/dL (ref 30.0–36.0)
MCV: 93.5 fL (ref 80.0–100.0)
Platelets: 394 10*3/uL (ref 150–400)
RBC: 3.84 MIL/uL — ABNORMAL LOW (ref 3.87–5.11)
RDW: 12.4 % (ref 11.5–15.5)
WBC: 14.8 10*3/uL — ABNORMAL HIGH (ref 4.0–10.5)
nRBC: 0 % (ref 0.0–0.2)

## 2019-04-30 LAB — TYPE AND SCREEN
ABO/RH(D): O POS
Antibody Screen: NEGATIVE

## 2019-04-30 LAB — GROUP B STREP BY PCR: Group B strep by PCR: NEGATIVE

## 2019-04-30 LAB — ABO/RH: ABO/RH(D): O POS

## 2019-04-30 LAB — SARS CORONAVIRUS 2 BY RT PCR (HOSPITAL ORDER, PERFORMED IN ~~LOC~~ HOSPITAL LAB): SARS Coronavirus 2: NEGATIVE

## 2019-04-30 MED ORDER — SOD CITRATE-CITRIC ACID 500-334 MG/5ML PO SOLN: 30.0000 mL | ORAL | Status: DC | PRN

## 2019-04-30 MED ORDER — PENICILLIN G 3 MILLION UNITS IVPB - SIMPLE MED
3.0000 10*6.[IU] | INTRAVENOUS | Status: DC
  Filled 2019-04-30 (×2): qty 100

## 2019-04-30 MED ORDER — BETAMETHASONE SOD PHOS & ACET 6 (3-3) MG/ML IJ SUSP
12.0000 mg | Freq: Once | INTRAMUSCULAR | Status: AC
  Administered 2019-04-30: 12 mg via INTRAMUSCULAR
  Filled 2019-04-30: qty 5

## 2019-04-30 MED ORDER — ONDANSETRON HCL 4 MG/2ML IJ SOLN: 4.0000 mg | Freq: Four times a day (QID) | INTRAMUSCULAR | Status: DC | PRN

## 2019-04-30 MED ORDER — PENICILLIN G POTASSIUM 5000000 UNITS IJ SOLR: 5.0000 10*6.[IU] | Freq: Once | INTRAMUSCULAR | Status: DC

## 2019-04-30 MED ORDER — FENTANYL-BUPIVACAINE-NACL 0.5-0.125-0.9 MG/250ML-% EP SOLN
EPIDURAL | Status: AC
  Filled 2019-04-30: qty 250

## 2019-04-30 MED ORDER — BETAMETHASONE SOD PHOS & ACET 6 (3-3) MG/ML IJ SUSP: 12.0000 mg | Freq: Once | INTRAMUSCULAR | Status: DC

## 2019-04-30 MED ORDER — FENTANYL-BUPIVACAINE-NACL 0.5-0.125-0.9 MG/250ML-% EP SOLN: 12.0000 mL/h | EPIDURAL | Status: DC | PRN

## 2019-04-30 MED ORDER — EPHEDRINE 5 MG/ML INJ: 10.0000 mg | INTRAVENOUS | Status: DC | PRN

## 2019-04-30 MED ORDER — OXYTOCIN 40 UNITS IN NORMAL SALINE INFUSION - SIMPLE MED
1.0000 m[IU]/min | INTRAVENOUS | Status: DC
  Administered 2019-04-30: 12 m[IU]/min via INTRAVENOUS
  Administered 2019-04-30: 2 m[IU]/min via INTRAVENOUS
  Administered 2019-05-01: 14 m[IU]/min via INTRAVENOUS
  Filled 2019-04-30: qty 1000

## 2019-04-30 MED ORDER — OXYTOCIN BOLUS FROM INFUSION
500.0000 mL | Freq: Once | INTRAVENOUS | Status: AC
  Administered 2019-05-01: 500 mL via INTRAVENOUS

## 2019-04-30 MED ORDER — SODIUM CHLORIDE 0.9 % IV SOLN
5.0000 10*6.[IU] | Freq: Once | INTRAVENOUS | Status: AC
  Administered 2019-04-30: 5 10*6.[IU] via INTRAVENOUS
  Filled 2019-04-30: qty 5

## 2019-04-30 MED ORDER — LACTATED RINGERS IV SOLN
INTRAVENOUS | Status: DC
  Administered 2019-04-30 (×2): via INTRAVENOUS

## 2019-04-30 MED ORDER — PHENYLEPHRINE 40 MCG/ML (10ML) SYRINGE FOR IV PUSH (FOR BLOOD PRESSURE SUPPORT)
PREFILLED_SYRINGE | INTRAVENOUS | Status: AC
  Filled 2019-04-30: qty 10

## 2019-04-30 MED ORDER — LIDOCAINE HCL (PF) 1 % IJ SOLN
30.0000 mL | INTRAMUSCULAR | Status: DC | PRN
  Filled 2019-04-30: qty 30

## 2019-04-30 MED ORDER — TERBUTALINE SULFATE 1 MG/ML IJ SOLN: 0.2500 mg | Freq: Once | INTRAMUSCULAR | Status: DC | PRN

## 2019-04-30 MED ORDER — SODIUM CHLORIDE (PF) 0.9 % IJ SOLN
INTRAMUSCULAR | Status: DC | PRN
  Administered 2019-04-30: 12 mL/h via EPIDURAL

## 2019-04-30 MED ORDER — OXYTOCIN 40 UNITS IN NORMAL SALINE INFUSION - SIMPLE MED: 2.5000 [IU]/h | INTRAVENOUS | Status: DC

## 2019-04-30 MED ORDER — LACTATED RINGERS IV SOLN: 500.0000 mL | Freq: Once | INTRAVENOUS | Status: DC

## 2019-04-30 MED ORDER — ACETAMINOPHEN 325 MG PO TABS: 650.0000 mg | ORAL_TABLET | ORAL | Status: DC | PRN

## 2019-04-30 MED ORDER — DIPHENHYDRAMINE HCL 50 MG/ML IJ SOLN: 12.5000 mg | INTRAMUSCULAR | Status: DC | PRN

## 2019-04-30 MED ORDER — OXYCODONE-ACETAMINOPHEN 5-325 MG PO TABS: 1.0000 | ORAL_TABLET | ORAL | Status: DC | PRN

## 2019-04-30 MED ORDER — PHENYLEPHRINE 40 MCG/ML (10ML) SYRINGE FOR IV PUSH (FOR BLOOD PRESSURE SUPPORT): 80.0000 ug | PREFILLED_SYRINGE | INTRAVENOUS | Status: DC | PRN

## 2019-04-30 MED ORDER — LIDOCAINE-EPINEPHRINE (PF) 2 %-1:200000 IJ SOLN
INTRAMUSCULAR | Status: DC | PRN
  Administered 2019-04-30 (×2): 2 mL via EPIDURAL

## 2019-04-30 MED ORDER — LACTATED RINGERS IV SOLN: 500.0000 mL | INTRAVENOUS | Status: DC | PRN

## 2019-04-30 MED ORDER — OXYCODONE-ACETAMINOPHEN 5-325 MG PO TABS: 2.0000 | ORAL_TABLET | ORAL | Status: DC | PRN

## 2019-04-30 MED ORDER — PENICILLIN G POTASSIUM 5000000 UNITS IJ SOLR: 3.0000 10*6.[IU] | INTRAMUSCULAR | Status: DC

## 2019-04-30 NOTE — Anesthesia Procedure Notes (Signed)
Epidural Patient location during procedure: OB Start time: 04/30/2019 6:25 PM End time: 04/30/2019 6:40 PM  Staffing Anesthesiologist: Freddrick March, MD Performed: anesthesiologist   Preanesthetic Checklist Completed: patient identified, pre-op evaluation, timeout performed, IV checked, risks and benefits discussed and monitors and equipment checked  Epidural Patient position: sitting Prep: site prepped and draped and DuraPrep Patient monitoring: continuous pulse ox, blood pressure, heart rate and cardiac monitor Approach: midline Location: L3-L4 Injection technique: LOR air  Needle:  Needle type: Tuohy  Needle gauge: 17 G Needle length: 9 cm Needle insertion depth: 6 cm Catheter type: closed end flexible Catheter size: 19 Gauge Catheter at skin depth: 12 cm Test dose: negative  Assessment Sensory level: T8 Events: blood not aspirated, injection not painful, no injection resistance, negative IV test and no paresthesia  Additional Notes Patient identified. Risks/Benefits/Options discussed with patient including but not limited to bleeding, infection, nerve damage, paralysis, failed block, incomplete pain control, headache, blood pressure changes, nausea, vomiting, reactions to medication both or allergic, itching and postpartum back pain. Confirmed with bedside nurse the patient's most recent platelet count. Confirmed with patient that they are not currently taking any anticoagulation, have any bleeding history or any family history of bleeding disorders. Patient expressed understanding and wished to proceed. All questions were answered. Sterile technique was used throughout the entire procedure. Please see nursing notes for vital signs. Test dose was given through epidural catheter and negative prior to continuing to dose epidural or start infusion. Warning signs of high block given to the patient including shortness of breath, tingling/numbness in hands, complete motor block,  or any concerning symptoms with instructions to call for help. Patient was given instructions on fall risk and not to get out of bed. All questions and concerns addressed with instructions to call with any issues or inadequate analgesia.  Reason for block:procedure for pain

## 2019-04-30 NOTE — Anesthesia Preprocedure Evaluation (Addendum)
Anesthesia Evaluation  Patient identified by MRN, date of birth, ID band Patient awake    Reviewed: Allergy & Precautions, NPO status , Patient's Chart, lab work & pertinent test results  Airway Mallampati: II  TM Distance: >3 FB Neck ROM: Full    Dental no notable dental hx.    Pulmonary neg pulmonary ROS, Current SmokerPatient did not abstain from smoking.,    Pulmonary exam normal breath sounds clear to auscultation       Cardiovascular negative cardio ROS Normal cardiovascular exam Rhythm:Regular Rate:Normal     Neuro/Psych Anxiety Bipolar Disorder negative neurological ROS  negative psych ROS   GI/Hepatic negative GI ROS, Neg liver ROS,   Endo/Other  negative endocrine ROS  Renal/GU negative Renal ROS  negative genitourinary   Musculoskeletal negative musculoskeletal ROS (+)   Abdominal   Peds  (+) ADHD Hematology negative hematology ROS (+)   Anesthesia Other Findings   Reproductive/Obstetrics (+) Pregnancy                             Anesthesia Physical Anesthesia Plan  ASA: II  Anesthesia Plan: Epidural   Post-op Pain Management:    Induction:   PONV Risk Score and Plan: Treatment may vary due to age or medical condition  Airway Management Planned: Natural Airway  Additional Equipment:   Intra-op Plan:   Post-operative Plan:   Informed Consent: I have reviewed the patients History and Physical, chart, labs and discussed the procedure including the risks, benefits and alternatives for the proposed anesthesia with the patient or authorized representative who has indicated his/her understanding and acceptance.       Plan Discussed with: Anesthesiologist  Anesthesia Plan Comments: (Patient identified. Risks, benefits, options discussed with patient including but not limited to bleeding, infection, nerve damage, paralysis, failed block, incomplete pain control,  headache, blood pressure changes, nausea, vomiting, reactions to medication, itching, and post partum back pain. Confirmed with bedside nurse the patient's most recent platelet count. Confirmed with the patient that they are not taking any anticoagulation, have any bleeding history or any family history of bleeding disorders. Patient expressed understanding and wishes to proceed. All questions were answered. )        Anesthesia Quick Evaluation

## 2019-04-30 NOTE — H&P (Signed)
Heather Hines is a 33 y.o. female presenting for PPROM confirmed in office with pool, nitrazine and fern.  She states possible leaking for 1 week.. OB History    Gravida  1   Para      Term      Preterm      AB      Living        SAB      TAB      Ectopic      Multiple      Live Births             Past Medical History:  Diagnosis Date  . Abnormal Pap smear of vagina 2017 and 2019  . ADHD   . Bipolar depression ([place])   . GAD (generalized anxiety disorder)   . 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 LEEP  2015   Family History: family history includes Congestive Heart Failure in her maternal grandmother; Parkinson's disease in her paternal grandfather. Social History:  reports that she has been smoking cigarettes. She has been smoking about 0.25 packs per day. She has never used smokeless tobacco. She reports previous drug use. She reports that she does not drink alcohol.     Maternal Diabetes: No Genetic Screening: Normal Maternal Ultrasounds/Referrals: Normal Fetal Ultrasounds or other Referrals:  None Maternal Substance Abuse:  No Significant Maternal Medications:  None Significant Maternal Lab Results:  None Other Comments:  None  ROS History Dilation: 3.5 Effacement (%): 90 Station: -2 Exam by:: Dr. Corinna Capra Blood pressure 135/75, pulse (!) 103, temperature 98.2 F (36.8 C), temperature source Oral, resp. rate 20, height 5' 5.5" (1.664 m), weight 79.8 kg, last menstrual period 07/30/2018. Exam Physical Exam  Prenatal labs: ABO, Rh: --/--/PENDING (10/22 1754) Antibody: PENDING (10/22 1754) Rubella:   RPR:    HBsAg:    HIV:    GBS:     Assessment/Plan: IUP at 35 5/7 PPROM Will give BMZ series over 12h if able Rapid GBS done and IV PCN started.  AROM of forebag.  PT with epidural now. WIll augment with pitocin and anticipate svd  Luz Lex 04/30/2019, 6:58 PM

## 2019-05-01 ENCOUNTER — Encounter (HOSPITAL_COMMUNITY): Payer: Self-pay | Admitting: Obstetrics

## 2019-05-01 LAB — RPR: RPR Ser Ql: NONREACTIVE

## 2019-05-01 MED ORDER — MEASLES, MUMPS & RUBELLA VAC IJ SOLR: 0.5000 mL | Freq: Once | INTRAMUSCULAR | Status: DC

## 2019-05-01 MED ORDER — TETANUS-DIPHTH-ACELL PERTUSSIS 5-2.5-18.5 LF-MCG/0.5 IM SUSP: 0.5000 mL | Freq: Once | INTRAMUSCULAR | Status: DC

## 2019-05-01 MED ORDER — IBUPROFEN 600 MG PO TABS
600.0000 mg | ORAL_TABLET | Freq: Four times a day (QID) | ORAL | Status: DC
  Administered 2019-05-01 – 2019-05-03 (×8): 600 mg via ORAL
  Filled 2019-05-01 (×9): qty 1

## 2019-05-01 MED ORDER — OXYCODONE-ACETAMINOPHEN 5-325 MG PO TABS: 2.0000 | ORAL_TABLET | ORAL | Status: DC | PRN

## 2019-05-01 MED ORDER — BENZOCAINE-MENTHOL 20-0.5 % EX AERO
1.0000 "application " | INHALATION_SPRAY | CUTANEOUS | Status: DC | PRN
  Administered 2019-05-01: 1 via TOPICAL
  Filled 2019-05-01: qty 56

## 2019-05-01 MED ORDER — WITCH HAZEL-GLYCERIN EX PADS: 1.0000 "application " | MEDICATED_PAD | CUTANEOUS | Status: DC | PRN

## 2019-05-01 MED ORDER — OXYCODONE HCL 5 MG PO TABS
5.0000 mg | ORAL_TABLET | ORAL | Status: DC | PRN
  Administered 2019-05-01: 5 mg via ORAL
  Filled 2019-05-01: qty 1

## 2019-05-01 MED ORDER — ONDANSETRON HCL 4 MG PO TABS: 4.0000 mg | ORAL_TABLET | ORAL | Status: DC | PRN

## 2019-05-01 MED ORDER — PRENATAL MULTIVITAMIN CH
1.0000 | ORAL_TABLET | Freq: Every day | ORAL | Status: DC
  Administered 2019-05-01 – 2019-05-03 (×3): 1 via ORAL
  Filled 2019-05-01 (×3): qty 1

## 2019-05-01 MED ORDER — ZOLPIDEM TARTRATE 5 MG PO TABS: 5.0000 mg | ORAL_TABLET | Freq: Every evening | ORAL | Status: DC | PRN

## 2019-05-01 MED ORDER — ONDANSETRON HCL 4 MG/2ML IJ SOLN: 4.0000 mg | INTRAMUSCULAR | Status: DC | PRN

## 2019-05-01 MED ORDER — MEDROXYPROGESTERONE ACETATE 150 MG/ML IM SUSP: 150.0000 mg | INTRAMUSCULAR | Status: DC | PRN

## 2019-05-01 MED ORDER — SIMETHICONE 80 MG PO CHEW: 80.0000 mg | CHEWABLE_TABLET | ORAL | Status: DC | PRN

## 2019-05-01 MED ORDER — DIBUCAINE (PERIANAL) 1 % EX OINT: 1.0000 "application " | TOPICAL_OINTMENT | CUTANEOUS | Status: DC | PRN

## 2019-05-01 MED ORDER — COCONUT OIL OIL: 1.0000 "application " | TOPICAL_OIL | Status: DC | PRN

## 2019-05-01 MED ORDER — SENNOSIDES-DOCUSATE SODIUM 8.6-50 MG PO TABS
2.0000 | ORAL_TABLET | ORAL | Status: DC
  Administered 2019-05-01 – 2019-05-02 (×2): 2 via ORAL
  Filled 2019-05-01 (×2): qty 2

## 2019-05-01 MED ORDER — NICOTINE 14 MG/24HR TD PT24
14.0000 mg | MEDICATED_PATCH | Freq: Every day | TRANSDERMAL | Status: DC
  Administered 2019-05-02: 14 mg via TRANSDERMAL
  Filled 2019-05-01: qty 1

## 2019-05-01 MED ORDER — OXYCODONE-ACETAMINOPHEN 5-325 MG PO TABS: 1.0000 | ORAL_TABLET | ORAL | Status: DC | PRN

## 2019-05-01 MED ORDER — DIPHENHYDRAMINE HCL 25 MG PO CAPS: 25.0000 mg | ORAL_CAPSULE | Freq: Four times a day (QID) | ORAL | Status: DC | PRN

## 2019-05-01 MED ORDER — ACETAMINOPHEN 325 MG PO TABS
650.0000 mg | ORAL_TABLET | ORAL | Status: DC | PRN
  Administered 2019-05-01 – 2019-05-02 (×3): 650 mg via ORAL
  Filled 2019-05-01 (×3): qty 2

## 2019-05-01 NOTE — Anesthesia Postprocedure Evaluation (Signed)
Anesthesia Post Note  Patient: Heather Hines  Procedure(s) Performed: AN AD HOC LABOR EPIDURAL     Patient location during evaluation: Mother Baby Anesthesia Type: Epidural Level of consciousness: awake and alert Pain management: pain level controlled Vital Signs Assessment: post-procedure vital signs reviewed and stable Respiratory status: spontaneous breathing, nonlabored ventilation and respiratory function stable Cardiovascular status: stable Postop Assessment: no headache, no backache and epidural receding Anesthetic complications: no    Last Vitals:  Vitals:   05/01/19 0911 05/01/19 1350  BP: 125/72 120/65  Pulse: 87 82  Resp: 18 18  Temp: 36.7 C 36.8 C  SpO2:      Last Pain:  Vitals:   05/01/19 1350  TempSrc: Oral  PainSc: 6    Pain Goal:                   [name]

## 2019-05-01 NOTE — Progress Notes (Signed)
Patient doing well No complaints  BP 129/85 (BP Location: Right Arm)   Pulse 87   Temp 99.1 F (37.3 C) (Oral)   Resp 18   Ht 5' 5.5" (1.664 m)   Wt 79.8 kg   LMP 07/30/2018   SpO2 100%   Breastfeeding Unknown   BMI 28.84 kg/m   PPD # 0  Doing well Routine care  Discharge home on Sunday

## 2019-05-01 NOTE — Lactation Note (Addendum)
This note was copied from a baby's chart. Lactation Consultation Note  Patient Name: Heather Hines JYNWG'N Date: 05/01/2019 Reason for consult: Initial assessment;Late-preterm 34-36.6wks;Infant < 6lbs;Primapara;1st time breastfeeding  P1 mother whose infant is now 77 hours old.  This is a LPTI at 35+6 weeks weighing < 6 lbs.  RN had already reviewed the LPTI policy and set up the DEBP for mother.  She had no questions at this time.  Mother did inform me that baby has been spitting a little bit but that she will continue to try feeding and supplementing her.  I reassured her that this is typical and to continue doing lots of STS.  Reminded to attempt to feed and supplement every three hours.  Mother verbalized understanding of the supplementation guidelines.    Mother will practice hand expression and feed back any EBM she obtains to baby.  Mother does not have a DEBP for home use but is a [facility] participant in [place].  Offered to fax a referral and mother appreciative.  Suggested she call the [facility] office this afternoon before the weekend to confirm pump rental.  Since she may be discharged Sunday I also explained our [facility] loaner program if desired.  Referral faxed.    She will call for latch assistance and any further concerns she may have.     Maternal Data Formula Feeding for Exclusion: No Has patient been taught Hand Expression?: Yes Does the patient have breastfeeding experience prior to this delivery?: No  Feeding    LATCH Score                   Interventions    Lactation Tools Discussed/Used WIC Program: Yes Pump Review: (No review needed) Initiated by:: Dyanna Seiter Date initiated:: 05/01/19   Consult Status Consult Status: Follow-up Date: 05/02/19 Follow-up type: In-patient    Sherronda Sweigert R Sahmir Weatherbee 05/01/2019, 2:07 PM

## 2019-05-02 LAB — CBC
HCT: 33.7 % — ABNORMAL LOW (ref 36.0–46.0)
Hemoglobin: 10.8 g/dL — ABNORMAL LOW (ref 12.0–15.0)
MCH: 31.8 pg (ref 26.0–34.0)
MCHC: 32 g/dL (ref 30.0–36.0)
MCV: 99.1 fL (ref 80.0–100.0)
Platelets: 372 10*3/uL (ref 150–400)
RBC: 3.4 MIL/uL — ABNORMAL LOW (ref 3.87–5.11)
RDW: 12.6 % (ref 11.5–15.5)
WBC: 18.8 10*3/uL — ABNORMAL HIGH (ref 4.0–10.5)
nRBC: 0 % (ref 0.0–0.2)

## 2019-05-02 NOTE — Progress Notes (Signed)
Patient doing well No complaints.  BP 105/61   Pulse 85   Temp 98 F (36.7 C) (Oral)   Resp 18   Ht 5' 5.5" (1.664 m)   Wt 79.8 kg   LMP 07/30/2018   SpO2 100%   Breastfeeding Unknown   BMI 28.84 kg/m  Results for orders placed or performed during the hospital encounter of 04/30/19 (from the past 24 hour(s))  CBC     Status: Abnormal   Collection Time: 05/02/19  5:06 AM  Result Value Ref Range   WBC 18.8 (H) 4.0 - 10.5 K/uL   RBC 3.40 (L) 3.87 - 5.11 MIL/uL   Hemoglobin 10.8 (L) 12.0 - 15.0 g/dL   HCT 33.7 (L) 36.0 - 46.0 %   MCV 99.1 80.0 - 100.0 fL   MCH 31.8 26.0 - 34.0 pg   MCHC 32.0 30.0 - 36.0 g/dL   RDW 12.6 11.5 - 15.5 %   Platelets 372 150 - 400 K/uL   nRBC 0.0 0.0 - 0.2 %   Lochia WNL  PPD # 1  Doing well Discharge home tomorrow

## 2019-05-02 NOTE — Plan of Care (Signed)
  Problem: Activity: Goal: Risk for activity intolerance will decrease Outcome: Completed/Met   Problem: Nutrition: Goal: Adequate nutrition will be maintained Outcome: Completed/Met   Problem: Elimination: Goal: Will not experience complications related to bowel motility Outcome: Completed/Met   Problem: Pain Managment: Goal: General experience of comfort will improve Outcome: Completed/Met   Problem: Safety: Goal: Ability to remain free from injury will improve Outcome: Completed/Met

## 2019-05-03 MED ORDER — IBUPROFEN 600 MG PO TABS: 600.0000 mg | ORAL_TABLET | Freq: Four times a day (QID) | ORAL | 0 refills | Status: DC

## 2019-05-03 NOTE — Discharge Summary (Signed)
Obstetric Discharge Summary Reason for Admission: rupture of membranes Prenatal Procedures: none Intrapartum Procedures: Vacuum Postpartum Procedures: none Complications-Operative and Postpartum: 2nd  degree perineal laceration Hemoglobin  Date Value Ref Range Status  05/02/2019 10.8 (L) 12.0 - 15.0 g/dL Final   HCT  Date Value Ref Range Status  05/02/2019 33.7 (L) 36.0 - 46.0 % Final    Physical Exam:  General: alert, cooperative and appears stated age 33: appropriate Uterine Fundus: firm Incision: healing well, no significant drainage, no dehiscence, no significant erythema DVT Evaluation: No evidence of DVT seen on physical exam.  Discharge Diagnoses: Term Pregnancy-delivered and PROM xunknown hours  Discharge Information: Date: 05/03/2019 Activity: pelvic rest Diet: routine Medications: Ibuprofen Condition: stable Instructions: refer to practice specific booklet Discharge to: home   Newborn Data: Live born female  Birth Weight: 5 lb 14.2 oz (2670 g) APGAR: 7, 9  Newborn Delivery   Birth date/time: 05/01/2019 06:12:00 Delivery type: Vaginal, Vacuum (Extractor)      Home with mother.  Cyril Mourning 05/03/2019, 8:24 AM

## 2019-05-03 NOTE — Lactation Note (Signed)
This note was copied from a baby's chart. Lactation Consultation Note  Patient Name: Heather Hines [id number]'K Date: 05/03/2019   P1, 52 hour LPTI, mom has been using Similac Neosure 22 kcal formula since earlier yesterday, she has not been latching infant to breast. Per mom, she was feeling overwhelm with breastfeeding  and she is not seeing  colostrum, she tried hand expression earlier yesterday with Nurse but did not see any milk. LC discussed with mom it is not unusual encouraged mom to use DEBP, continue latching infant at breast and ask for help from [facility] or Nurse  and do as much STS with infant as possible.  LC encourage mom to use DEBP every 3 hours  to help with breast stimulation and induction. Mom stated she is undecided about continuing with breastfeeding  at this time. LC discussed with mom we support her choice and decision on how she wants to feed her infant,  if she decides she wants to continue  with breastfeeding we are here to help , support and encourage her.   Maternal Data    Feeding Feeding Type: Bottle Fed - Formula Nipple Type: Nfant Slow Flow (purple)  LATCH Score                   Interventions    Lactation Tools Discussed/Used     Consult Status      Vicente Serene 05/03/2019, 2:49 AM

## 2019-05-03 NOTE — Clinical Social Work Maternal (Signed)
CLINICAL SOCIAL WORK MATERNAL/CHILD NOTE  Patient Details  Name: Heather Hines MRN: [id number] Date of Birth: 05/04/1986  Date:  20-Jan-2019  Clinical Social Worker Initiating Note:   Madilyn Fireman, MSW, LCSW-A Date/Time: Initiated:  05/03/19/0920     Child's Name:      Biological Parents:  Mother, Father   Need for Interpreter:  None   Reason for Referral:  Behavioral Health Concerns   Address:  [place] [address]    Phone number:  [phone] (home)     Additional phone number:   Household Members/Support Persons (HM/SP):   Household Member/Support Person 1   HM/SP Name Relationship DOB or Age  HM/SP -Heather Hines, Spouse    HM/SP -2        HM/SP -3        HM/SP -4        HM/SP -5        HM/SP -6        HM/SP -7        HM/SP -8          Natural Supports (not living in the home):  Friends, Immediate Family, Extended Family, Neighbors, Radiographer, therapeutic Supports: None   Employment: Unemployed   Type of Work:     Education:  Programmer, systems   Homebound arranged:    Museum/gallery curator Resources:  Medicaid   Other Resources:  Physicist, medical , [place] Considerations Which May Impact Care:  None  Strengths:  Psychotropic Medications, Ability to meet basic needs , Engineer, materials, Home prepared for child    Psychotropic Medications:  Zoloft, Vyvanse      Pediatrician:    [place] area  Pediatrician List:   [place] (Dr. Marcell Anger at [facility] [place])  [place]      Pediatrician Fax Number:    Risk Factors/Current Problems:  None   Cognitive State:  Alert , Goal Oriented    Mood/Affect:  Bright , Calm , Comfortable , Happy , Interested    CSW Assessment: CSW received consult for MOB due to her history of substance use. CSW met with MOB, FOB Heather Hines, and newborn Heather Hines at bedside. CSW obtained  permission from MOB to speak with FOB present. CSW inquired with MOB regarding her history of substance use, MOB reports she has been clean from drugs since 2012. MOB denies any current substance use of any kind. CSW explained hospital drug screening policies and parents stated understanding. CSW inquired with MOB about her mental health history of bipolar, depression, and ADHD and MOB confirmed those diagnoses. MOB reports she takes 173m of Zoloft daily and takes 236mof Vyvanse as needed, but hasn't taken it since last week. MOB reports she graduated high school and is currently unemployed. M[facility] reports [facility] has not [facility] has intentions to. MOB reports she has a great support system outside of the hospital including family and friends. MOB reports having all items at home needed for newborn except preemie diapers. MOB reports her mother will assist with obtaining preemie diapers and clothes. MOB reports [facility] MOB and CSW discussed baby blues period versus postpartum depression. MOB reports having a great relationship with her PCP -  Dr. Marcell Anger and will speak with her regarding any symptoms if they occur. CSW and parents discussed SIDS and the importance of safe sleeping habits. CSW advised both parents to make sure they do not expose the newborn to third hand tobacco smoke as they are both smokers. MOB reports having a car seat that is installed with knowledge of use. MOB reports having no concerns at this time and is extremely happy to finally be a mother. CSW encouraged MOB to reach out for assistance if needs arise, she is agreeable.  CSW will continue to monitor chart for UDS and cord results and will make a report if warranted.  CSW Plan/Description:  CSW Will Continue to Monitor Umbilical Cord Tissue Drug Screen Results and Make Report if Earlie Counts, LCSW 05/03/2019, 9:29 AM

## 2019-05-04 ENCOUNTER — Ambulatory Visit: Payer: Self-pay

## 2019-05-04 NOTE — Lactation Note (Signed)
This note was copied from a baby's chart. Lactation Consultation Note  Patient Name: Heather Hines IZTIW'P Date: 05/04/2019 Reason for consult: Follow-up assessment  P1 mother whose infant is now 13 hours old.  This is a LPTI at 35+5 weeks weighing < 6 lbs.  Mother has been bottle feeding exclusively since 10/23 a.m.  Verified with mother that she prefers to formula feed only.  Mother acknowledged that this was correct.  Her breasts are very heavy and full now.  She is not engorged. She has a few lumpy areas to her left breast.  Instructed to massage and hand express/hand pump a little bit to relieve discomfort.  Manual pump provided with instructions for use.  She will call her RN when ready to use ice packs to help relieve fullness.  Mother advised to ice for 15-20 minutes a couple times a day or as needed.  Discussed the use of cabbage leaves as desired.  Mother appreciative of help.  Rn updated.   Maternal Data    Feeding Feeding Type: Bottle Fed - Formula Nipple Type: Slow - flow  LATCH Score                   Interventions    Lactation Tools Discussed/Used     Consult Status Consult Status: Complete Date: 05/04/19 Follow-up type: Call as needed    Lanice Schwab Nathalie Cavendish 05/04/2019, 2:32 PM

## 2019-07-27 NOTE — H&P (Signed)
Heather Hines is an 34 y.o. female. Presenting for perineorrhaphy. Had a vaginal delivery c/b separation of labia majora causing painful intercourse. Desires surgical correction.    Pertinent Gynecological History: Menses: flow is moderate Pap NIL 2020 OB History: G1, P0101   Menstrual History: No LMP recorded.    Past Medical History:  Diagnosis Date  . Abnormal Pap smear of vagina 2017 and 2019  . ADHD   . Bipolar depression ([facility])   . GAD (generalized anxiety disorder)   . 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 LEEP  2015    Family History  Problem Relation Age of Onset  . Congestive Heart Failure Maternal Grandmother   . Parkinson's disease Paternal Grandfather     Social History:  reports that she has been smoking cigarettes. She has been smoking about 0.25 packs per day. She has never used smokeless tobacco. She reports previous drug use. She reports that she does not drink alcohol.  Allergies:  Allergies  Allergen Reactions  . Biaxin [Clarithromycin]     No medications prior to admission.    Review of Systems  unknown if currently breastfeeding. Physical Exam  Gen: well appearing, NAD CV: Reg rate Pulm: NWOB Abd: soft, nondistended, nontender, no masses GYN: uterus 8 week size, no adnexa ttp/CMT. Small area of seperation of labia majora tissue and the area of the perineum Ext: No edema b/l   No results found for this or any previous visit (from the past 24 hour(s)).  No results found.  Assessment/Plan: 34 yo G1P0101 presenting for surgical correction of laceration from delivery in addition to mirena IUD insertion. Risks were reviewed including infection, bleeding, damage to surrounding structures, need for additional procedures, pain s/s scarring, failure, and thromboembolic disease.We reviewed IUD risks including ectopic pregnancy, failure, uterine perforation, etc. We reviewed typical  postoperative course such as pelvic rest, POV, and restrictions. We discussed not getting in water/using tampons.   Madelaine Etienne Rodolphe Ed[place]ston 07/27/2019, 10:07 AM

## 2019-07-31 ENCOUNTER — Other Ambulatory Visit: Payer: Self-pay

## 2019-07-31 ENCOUNTER — Encounter (HOSPITAL_COMMUNITY): Payer: Self-pay | Admitting: *Deleted

## 2019-07-31 NOTE — Progress Notes (Signed)
Spoke w/ via phone for pre-op interview--- PT Lab needs dos----  Urine preg             Lab results------ no COVID test ------ 08-03-2019 @ 1515 Arrive at ------- 0530 NPO after ------ MN Medications to take morning of surgery ----- Prilosec w/ sips of water Diabetic medication ----- n/a Patient Special Instructions ----- n/a Pre-Op special Istructions ----- n/a Patient verbalized understanding of instructions that were given at this phone interview. Patient denies shortness of breath, chest pain, fever, cough a this phone interview.

## 2019-08-03 ENCOUNTER — Other Ambulatory Visit (HOSPITAL_COMMUNITY)
Admission: RE | Admit: 2019-08-03 | Discharge: 2019-08-03 | Disposition: A | Discharge status: Home / Self Care | Payer: Medicaid Other | Source: Ambulatory Visit | Attending: Obstetrics and Gynecology | Admitting: Obstetrics and Gynecology

## 2019-08-03 DIAGNOSIS — Z01812 Encounter for preprocedural laboratory examination: Secondary | ICD-10-CM | POA: Insufficient documentation

## 2019-08-03 DIAGNOSIS — Z20822 Contact with and (suspected) exposure to covid-19: Secondary | ICD-10-CM | POA: Insufficient documentation

## 2019-08-03 LAB — SARS CORONAVIRUS 2 (TAT 6-24 HRS): SARS Coronavirus 2: NEGATIVE

## 2019-08-05 ENCOUNTER — Encounter (HOSPITAL_BASED_OUTPATIENT_CLINIC_OR_DEPARTMENT_OTHER): Payer: Self-pay | Admitting: Certified Registered Nurse Anesthetist

## 2019-08-05 NOTE — Anesthesia Preprocedure Evaluation (Deleted)
Anesthesia Evaluation    Airway        Dental   Pulmonary Current Smoker,           Cardiovascular negative cardio ROS       Neuro/Psych negative neurological ROS     GI/Hepatic negative GI ROS, Neg liver ROS,   Endo/Other  negative endocrine ROS  Renal/GU negative Renal ROS     Musculoskeletal   Abdominal   Peds  Hematology negative hematology ROS (+)   Anesthesia Other Findings   Reproductive/Obstetrics                             Anesthesia Physical Anesthesia Plan  ASA: II  Anesthesia Plan: General   Post-op Pain Management:    Induction: Intravenous  PONV Risk Score and Plan:   Airway Management Planned: LMA  Additional Equipment:   Intra-op Plan:   Post-operative Plan: Extubation in OR  Informed Consent:   Plan Discussed with:   Anesthesia Plan Comments:         Anesthesia Quick Evaluation

## 2019-08-06 ENCOUNTER — Ambulatory Visit (HOSPITAL_BASED_OUTPATIENT_CLINIC_OR_DEPARTMENT_OTHER)
Admission: RE | Admit: 2019-08-06 | Payer: Medicaid Other | Source: Home / Self Care | Admitting: Obstetrics and Gynecology

## 2019-08-06 HISTORY — DX: Personal history of other diseases of the female genital tract: Z87.42

## 2019-08-06 SURGERY — PERINEOPLASTY
Anesthesia: Choice

## 2019-08-17 ENCOUNTER — Encounter (HOSPITAL_BASED_OUTPATIENT_CLINIC_OR_DEPARTMENT_OTHER): Payer: Self-pay | Admitting: Obstetrics and Gynecology

## 2019-08-17 ENCOUNTER — Other Ambulatory Visit: Payer: Self-pay

## 2019-08-17 NOTE — Progress Notes (Signed)
Spoke w/ via phone for pre-op interview--- PT Lab needs dos----   Urine preg            Lab results------ no COVID test ------ 08-20-2019 @ 1500 Arrive at ------- 0530 NPO after ------ MN Medications to take morning of surgery -----  Prilosec w/ sips of water  Diabetic medication ----- n/a Patient Special Instructions ----- n/a Pre-Op special Istructions ----- n/a Patient verbalized understanding of instructions that were given at this phone interview. Patient denies shortness of breath, chest pain, fever, cough a this phone interview.

## 2019-08-20 ENCOUNTER — Other Ambulatory Visit (HOSPITAL_COMMUNITY): Payer: Medicaid Other

## 2019-08-21 ENCOUNTER — Other Ambulatory Visit (HOSPITAL_COMMUNITY)
Admission: RE | Admit: 2019-08-21 | Discharge: 2019-08-21 | Disposition: A | Discharge status: Home / Self Care | Payer: Medicaid Other | Source: Ambulatory Visit | Attending: Obstetrics and Gynecology | Admitting: Obstetrics and Gynecology

## 2019-08-21 DIAGNOSIS — Z20822 Contact with and (suspected) exposure to covid-19: Secondary | ICD-10-CM | POA: Insufficient documentation

## 2019-08-21 DIAGNOSIS — Z01812 Encounter for preprocedural laboratory examination: Secondary | ICD-10-CM | POA: Insufficient documentation

## 2019-08-21 LAB — SARS CORONAVIRUS 2 (TAT 6-24 HRS): SARS Coronavirus 2: NEGATIVE

## 2019-08-24 ENCOUNTER — Ambulatory Visit (HOSPITAL_BASED_OUTPATIENT_CLINIC_OR_DEPARTMENT_OTHER): Payer: Medicaid Other | Admitting: Anesthesiology

## 2019-08-24 ENCOUNTER — Encounter (HOSPITAL_BASED_OUTPATIENT_CLINIC_OR_DEPARTMENT_OTHER): Payer: Self-pay | Admitting: Obstetrics and Gynecology

## 2019-08-24 ENCOUNTER — Ambulatory Visit (HOSPITAL_BASED_OUTPATIENT_CLINIC_OR_DEPARTMENT_OTHER)
Admission: RE | Admit: 2019-08-24 | Discharge: 2019-08-24 | Disposition: A | Discharge status: Home / Self Care | Payer: Medicaid Other | Attending: Obstetrics and Gynecology | Admitting: Obstetrics and Gynecology

## 2019-08-24 ENCOUNTER — Encounter (HOSPITAL_BASED_OUTPATIENT_CLINIC_OR_DEPARTMENT_OTHER)
Admission: RE | Disposition: A | Discharge status: Home / Self Care | Payer: Self-pay | Source: Home / Self Care | Attending: Obstetrics and Gynecology

## 2019-08-24 DIAGNOSIS — Z30430 Encounter for insertion of intrauterine contraceptive device: Secondary | ICD-10-CM | POA: Insufficient documentation

## 2019-08-24 DIAGNOSIS — Z79899 Other long term (current) drug therapy: Secondary | ICD-10-CM | POA: Diagnosis not present

## 2019-08-24 DIAGNOSIS — F17210 Nicotine dependence, cigarettes, uncomplicated: Secondary | ICD-10-CM | POA: Insufficient documentation

## 2019-08-24 DIAGNOSIS — F909 Attention-deficit hyperactivity disorder, unspecified type: Secondary | ICD-10-CM | POA: Insufficient documentation

## 2019-08-24 DIAGNOSIS — F319 Bipolar disorder, unspecified: Secondary | ICD-10-CM | POA: Insufficient documentation

## 2019-08-24 DIAGNOSIS — N941 Unspecified dyspareunia: Secondary | ICD-10-CM | POA: Diagnosis present

## 2019-08-24 DIAGNOSIS — F411 Generalized anxiety disorder: Secondary | ICD-10-CM | POA: Insufficient documentation

## 2019-08-24 HISTORY — PX: PERINEOPLASTY: SHX2218

## 2019-08-24 HISTORY — PX: INTRAUTERINE DEVICE (IUD) INSERTION: SHX5877

## 2019-08-24 LAB — TYPE AND SCREEN
ABO/RH(D): O POS
Antibody Screen: NEGATIVE

## 2019-08-24 LAB — ABO/RH: ABO/RH(D): O POS

## 2019-08-24 LAB — POCT PREGNANCY, URINE: Preg Test, Ur: NEGATIVE

## 2019-08-24 SURGERY — PERINEOPLASTY
Anesthesia: General | Site: Vagina

## 2019-08-24 MED ORDER — LIDOCAINE HCL 1 % IJ SOLN
INTRAMUSCULAR | Status: DC | PRN
  Administered 2019-08-24: 10 mL

## 2019-08-24 MED ORDER — FENTANYL CITRATE (PF) 100 MCG/2ML IJ SOLN
25.0000 ug | INTRAMUSCULAR | Status: DC | PRN
  Filled 2019-08-24: qty 1

## 2019-08-24 MED ORDER — LIDOCAINE 2% (20 MG/ML) 5 ML SYRINGE
INTRAMUSCULAR | Status: AC
  Filled 2019-08-24: qty 5

## 2019-08-24 MED ORDER — LACTATED RINGERS IV SOLN
INTRAVENOUS | Status: DC
  Filled 2019-08-24: qty 1000

## 2019-08-24 MED ORDER — FENTANYL CITRATE (PF) 100 MCG/2ML IJ SOLN
INTRAMUSCULAR | Status: AC
  Filled 2019-08-24: qty 2

## 2019-08-24 MED ORDER — KETOROLAC TROMETHAMINE 30 MG/ML IJ SOLN
INTRAMUSCULAR | Status: DC | PRN
  Administered 2019-08-24: 30 mg via INTRAVENOUS

## 2019-08-24 MED ORDER — ARTIFICIAL TEARS OPHTHALMIC OINT
TOPICAL_OINTMENT | OPHTHALMIC | Status: AC
  Filled 2019-08-24: qty 3.5

## 2019-08-24 MED ORDER — KETOROLAC TROMETHAMINE 30 MG/ML IJ SOLN
30.0000 mg | Freq: Once | INTRAMUSCULAR | Status: DC | PRN
  Filled 2019-08-24: qty 1

## 2019-08-24 MED ORDER — PROPOFOL 10 MG/ML IV BOLUS
INTRAVENOUS | Status: DC | PRN
  Administered 2019-08-24: 50 mg via INTRAVENOUS
  Administered 2019-08-24: 200 mg via INTRAVENOUS

## 2019-08-24 MED ORDER — PROPOFOL 500 MG/50ML IV EMUL
INTRAVENOUS | Status: AC
  Filled 2019-08-24: qty 50

## 2019-08-24 MED ORDER — DEXAMETHASONE SODIUM PHOSPHATE 10 MG/ML IJ SOLN
INTRAMUSCULAR | Status: DC | PRN
  Administered 2019-08-24: 5 mg via INTRAVENOUS

## 2019-08-24 MED ORDER — ONDANSETRON HCL 4 MG/2ML IJ SOLN
INTRAMUSCULAR | Status: AC
  Filled 2019-08-24: qty 2

## 2019-08-24 MED ORDER — PROMETHAZINE HCL 25 MG/ML IJ SOLN
6.2500 mg | INTRAMUSCULAR | Status: DC | PRN
  Filled 2019-08-24: qty 1

## 2019-08-24 MED ORDER — OXYCODONE HCL 5 MG PO TABS
ORAL_TABLET | ORAL | Status: AC
  Filled 2019-08-24: qty 1

## 2019-08-24 MED ORDER — DEXAMETHASONE SODIUM PHOSPHATE 10 MG/ML IJ SOLN
INTRAMUSCULAR | Status: AC
  Filled 2019-08-24: qty 1

## 2019-08-24 MED ORDER — MIDAZOLAM HCL 2 MG/2ML IJ SOLN
INTRAMUSCULAR | Status: DC | PRN
  Administered 2019-08-24: 2 mg via INTRAVENOUS

## 2019-08-24 MED ORDER — FENTANYL CITRATE (PF) 100 MCG/2ML IJ SOLN
INTRAMUSCULAR | Status: DC | PRN
  Administered 2019-08-24: 50 ug via INTRAVENOUS

## 2019-08-24 MED ORDER — ONDANSETRON HCL 4 MG/2ML IJ SOLN
INTRAMUSCULAR | Status: DC | PRN
  Administered 2019-08-24: 4 mg via INTRAVENOUS

## 2019-08-24 MED ORDER — KETOROLAC TROMETHAMINE 30 MG/ML IJ SOLN
INTRAMUSCULAR | Status: AC
  Filled 2019-08-24: qty 1

## 2019-08-24 MED ORDER — LIDOCAINE 2% (20 MG/ML) 5 ML SYRINGE
INTRAMUSCULAR | Status: DC | PRN
  Administered 2019-08-24: 100 mg via INTRAVENOUS

## 2019-08-24 MED ORDER — MIDAZOLAM HCL 2 MG/2ML IJ SOLN
INTRAMUSCULAR | Status: AC
  Filled 2019-08-24: qty 2

## 2019-08-24 MED ORDER — OXYCODONE HCL 5 MG PO TABS
10.0000 mg | ORAL_TABLET | Freq: Once | ORAL | Status: AC
  Administered 2019-08-24: 10 mg via ORAL
  Filled 2019-08-24: qty 2

## 2019-08-24 SURGICAL SUPPLY — 23 items
CATH ROBINSON RED A/P 16FR (CATHETERS) ×3 IMPLANT
ELECT REM PT RETURN 9FT ADLT (ELECTROSURGICAL) ×3
ELECTRODE REM PT RTRN 9FT ADLT (ELECTROSURGICAL) ×1 IMPLANT
GLOVE BIO SURGEON STRL SZ 6.5 (GLOVE) ×2 IMPLANT
GLOVE BIO SURGEONS STRL SZ 6.5 (GLOVE) ×1
GLOVE BIOGEL PI IND STRL 6.5 (GLOVE) ×1 IMPLANT
GLOVE BIOGEL PI IND STRL 7.0 (GLOVE) ×2 IMPLANT
GLOVE BIOGEL PI INDICATOR 6.5 (GLOVE) ×2
GLOVE BIOGEL PI INDICATOR 7.0 (GLOVE) ×4
GLOVE ECLIPSE 6.5 STRL STRAW (GLOVE) ×3 IMPLANT
GOWN STRL REUS W/ TWL LRG LVL3 (GOWN DISPOSABLE) ×2 IMPLANT
GOWN STRL REUS W/TWL LRG LVL3 (GOWN DISPOSABLE) ×4
HIBICLENS CHG 4% 4OZ (MISCELLANEOUS) ×3 IMPLANT
Mirena IiUD ×3 IMPLANT
NEEDLE HYPO 22GX1.5 SAFETY (NEEDLE) ×3 IMPLANT
PACK VAGINAL MINOR WOMEN LF (CUSTOM PROCEDURE TRAY) ×3 IMPLANT
PAD OB MATERNITY 4.3X12.25 (PERSONAL CARE ITEMS) ×3 IMPLANT
PAD PREP 24X48 CUFFED NSTRL (MISCELLANEOUS) ×3 IMPLANT
PENCIL BUTTON HOLSTER BLD 10FT (ELECTRODE) ×3 IMPLANT
SUT CHROMIC 5 0 P 3 (SUTURE) ×3 IMPLANT
SUT VIC AB 3-0 SH 27 (SUTURE) ×2
SUT VIC AB 3-0 SH 27X BRD (SUTURE) ×1 IMPLANT
TOWEL OR 17X26 10 PK STRL BLUE (TOWEL DISPOSABLE) ×6 IMPLANT

## 2019-08-24 NOTE — Anesthesia Postprocedure Evaluation (Signed)
Anesthesia Post Note  Patient: Heather Hines  Procedure(s) Performed: PERINEORRHAPHY (N/A Vagina ) INTRAUTERINE DEVICE (IUD) INSERTION (N/A Vagina )     Patient location during evaluation: PACU Anesthesia Type: General Level of consciousness: awake and alert Pain management: pain level controlled Vital Signs Assessment: post-procedure vital signs reviewed and stable Respiratory status: spontaneous breathing, nonlabored ventilation, respiratory function stable and patient connected to nasal cannula oxygen Cardiovascular status: blood pressure returned to baseline and stable Postop Assessment: no apparent nausea or vomiting Anesthetic complications: no    Last Vitals:  Vitals:   08/24/19 0820 08/24/19 0830  BP:  118/70  Pulse: 85 71  Resp: 17 18  Temp:    SpO2: 98% 97%    Last Pain:  Vitals:   08/24/19 0830  TempSrc:   PainSc: 7                  Jaivian Battaglini S

## 2019-08-24 NOTE — H&P (Signed)
Heather Hines is an 34 y.o. female. Presenting for perineorrhaphy. Had a vaginal delivery c/b separation of labia majora causing painful intercourse. Desires surgical correction.    Pertinent Gynecological History: Menses: flow is moderate Pap NIL 2020 OB History: G1, P0101   Menstrual History: Patient's last menstrual period was 07/31/2019 (exact date).    Past Medical History:  Diagnosis Date  . ADHD   . Bipolar depression (HCC)    followed by behavior health  . GAD (generalized anxiety disorder)   . History of abnormal cervical Pap smear   . 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 LEEP  2015    gyn office  . NO PAST SURGERIES      Family History  Problem Relation Age of Onset  . Congestive Heart Failure Maternal Grandmother   . Parkinson's disease Paternal Grandfather     Social History:  reports that she has been smoking cigarettes. She has a 8.00 pack-year smoking history. She has never used smokeless tobacco. She reports previous drug use. She reports that she does not drink alcohol.  Allergies:  Allergies  Allergen Reactions  . Biaxin [Clarithromycin] Other (See Comments)    Unknown childhood reaction    Medications Prior to Admission  Medication Sig Dispense Refill Last Dose  . acetaminophen (TYLENOL) 325 MG tablet Take 650 mg by mouth every 6 (six) hours as needed.   Past Week at Unknown time  . busPIRone (BUSPAR) 10 MG tablet Take 10 mg by mouth daily as needed.   08/23/2019 at Unknown time  . ibuprofen (ADVIL) 200 MG tablet Take 200 mg by mouth every 6 (six) hours as needed.   Past Week at Unknown time  . lisdexamfetamine (VYVANSE) 40 MG capsule Take 40 mg by mouth every morning.   08/23/2019 at Unknown time  . omeprazole (PRILOSEC OTC) 20 MG tablet Take 20 mg by mouth as needed (heartburn).   08/24/2019 at 0600  . sertraline (ZOLOFT) 50 MG tablet Take 150 mg by  mouth at bedtime.   08/23/2019 at Unknown time    Review of Systems  Blood pressure 118/81, pulse (!) 101, temperature (!) 97.4 F (36.3 C), temperature source Oral, resp. rate 16, height 5\' 6"  (1.676 m), weight 74.2 kg, last menstrual period 07/31/2019, SpO2 98 %, not currently breastfeeding. Physical Exam  Gen: well appearing, NAD CV: Reg rate Pulm: NWOB Abd: soft, nondistended, nontender, no masses GYN: uterus 8 week size, no adnexa ttp/CMT. Small area of seperation of labia majora tissue and the area of the perineum Ext: No edema b/l   No results found for this or any previous visit (from the past 24 hour(s)).  No results found.  Assessment/Plan: 35 yo G1P0101 presenting for surgical correction of laceration from delivery in addition to mirena IUD insertion. Risks were reviewed including infection, bleeding, damage to surrounding structures, need for additional procedures, pain s/s scarring, failure, and thromboembolic disease.We reviewed IUD risks including ectopic pregnancy, failure, uterine perforation, etc. We reviewed typical postoperative course such as pelvic rest, POV, and restrictions. We discussed not getting in water/using tampons.   38 Heather Hines 08/24/2019, 7:23 AM

## 2019-08-24 NOTE — Transfer of Care (Signed)
Immediate Anesthesia Transfer of Care Note  Patient: Heather Hines  Procedure(s) Performed: PERINEORRHAPHY (N/A Vagina ) INTRAUTERINE DEVICE (IUD) INSERTION (N/A Vagina )  Patient Location: PACU  Anesthesia Type:General  Level of Consciousness: awake, alert , oriented and patient cooperative  Airway & Oxygen Therapy: Patient Spontanous Breathing and Patient connected to nasal cannula oxygen  Post-op Assessment: Report given to RN and Post -op Vital signs reviewed and stable  Post vital signs: Reviewed and stable  Last Vitals:  Vitals Value Taken Time  BP    Temp    Pulse    Resp    SpO2      Last Pain:  Vitals:   08/24/19 0646  TempSrc: Oral  PainSc: 0-No pain      Patients Stated Pain Goal: 3 (08/24/19 0646)  Complications: No apparent anesthesia complications

## 2019-08-24 NOTE — Op Note (Signed)
PREOPERATIVE DIAGNOSES: 1. Ill healed perineal laceration 2. Desires contraception  POSTOPERATIVE DIAGNOSES: Same  PROCEDURE PERFORMED:  perineorrhaphy and mirena IUD insertion  SURGEON: Dr. Belva Agee  ANESTHESIA: Paracervical block and IV sedation  ESTIMATED BLOOD LOSS: 10 cc.  COMPLICATIONS: None  TUBES: None.  DRAINS: None  PATHOLOGY: None  FINDINGS: On exam, under anesthesia, normal appearing vagina, 6 week sized uterus, small area of fusion of the labia majora to the perineum causing a narrowed introitus and a q-tip sized defect.   Operative findings demonstrated same as above  Procedure: Risks,benefits and alternatives were discussed preop. Informed consent was given.  The patient was taken to the operating room where she was properly prepped and draped in sterile manner under general anesthesia.  Attention turned to the area of ill-healed perineum. The fused labia majora was separated sharply allowing a normal vaginal opening.The edges were each reapproximated with one stitch each with 3'0 vicryl. Good hemostasis.  Attention turned to vagina. Speculum inserted. Anterior lip of cervix grasped with tenaculum. Paracervical blocked given. Uterus sounded to 68mm. Mirena IUD inserted per package instructions easily and without complications. Strings cut to 1-2cm in length and tucked behind the cervix.   All instruments were removed from vagina.The sponge and lap counts were correct times 2 at this time. The patient's procedure was terminated. We then awakened her. She was sent to the Recovery Room in good condition.    Belva Agee MD

## 2019-08-24 NOTE — Progress Notes (Signed)
No updates to above H&P. Patient arrived NPO and was consented in PACU. Risks again discussed, all questions answered, and consent signed. Proceed with above surgery.    Nimra Puccinelli MD  

## 2019-08-24 NOTE — Discharge Instructions (Signed)

## 2019-08-24 NOTE — Anesthesia Procedure Notes (Signed)
Procedure Name: LMA Insertion Date/Time: 08/24/2019 7:33 AM Performed by: Tyrone Nine, CRNA Pre-anesthesia Checklist: Patient identified, Timeout performed, Emergency Drugs available, Patient being monitored and Suction available Patient Re-evaluated:Patient Re-evaluated prior to induction Oxygen Delivery Method: Circle system utilized Preoxygenation: Pre-oxygenation with 100% oxygen Induction Type: IV induction Ventilation: Mask ventilation without difficulty LMA: LMA inserted LMA Size: 4.0 Number of attempts: 1 Airway Equipment and Method: Bite block Placement Confirmation: breath sounds checked- equal and bilateral,  CO2 detector and positive ETCO2 Tube secured with: Tape Dental Injury: Teeth and Oropharynx as per pre-operative assessment

## 2019-08-24 NOTE — Anesthesia Preprocedure Evaluation (Addendum)
Anesthesia Evaluation  Patient identified by MRN, date of birth, ID band Patient awake    Reviewed: Allergy & Precautions, NPO status , Patient's Chart, lab work & pertinent test results  Airway Mallampati: II  TM Distance: >3 FB Neck ROM: Full    Dental no notable dental hx. (+) Teeth Intact, Dental Advisory Given,    Pulmonary Current Smoker and Patient abstained from smoking.,    Pulmonary exam normal breath sounds clear to auscultation       Cardiovascular negative cardio ROS Normal cardiovascular exam Rhythm:Regular Rate:Normal     Neuro/Psych Bipolar Disorder negative neurological ROS     GI/Hepatic negative GI ROS, Neg liver ROS,   Endo/Other  negative endocrine ROS  Renal/GU negative Renal ROS  negative genitourinary   Musculoskeletal negative musculoskeletal ROS (+)   Abdominal   Peds negative pediatric ROS (+)  Hematology negative hematology ROS (+)   Anesthesia Other Findings   Reproductive/Obstetrics negative OB ROS                            Anesthesia Physical Anesthesia Plan  ASA: II  Anesthesia Plan: General   Post-op Pain Management:    Induction: Intravenous  PONV Risk Score and Plan: 2 and Ondansetron, Dexamethasone and Treatment may vary due to age or medical condition  Airway Management Planned: LMA  Additional Equipment:   Intra-op Plan:   Post-operative Plan: Extubation in OR  Informed Consent: I have reviewed the patients History and Physical, chart, labs and discussed the procedure including the risks, benefits and alternatives for the proposed anesthesia with the patient or authorized representative who has indicated his/her understanding and acceptance.     Dental advisory given  Plan Discussed with: CRNA and Surgeon  Anesthesia Plan Comments:         Anesthesia Quick Evaluation

## 2019-12-27 IMAGING — CR DG CHEST 2V
2 series · 2 of 2 positions shown · non-contrast
Comparison: 08/08/2008

CLINICAL DATA: Chest pain, fever and cough for 1 week.

EXAM:
CHEST - 2 VIEW

[w chest pa]
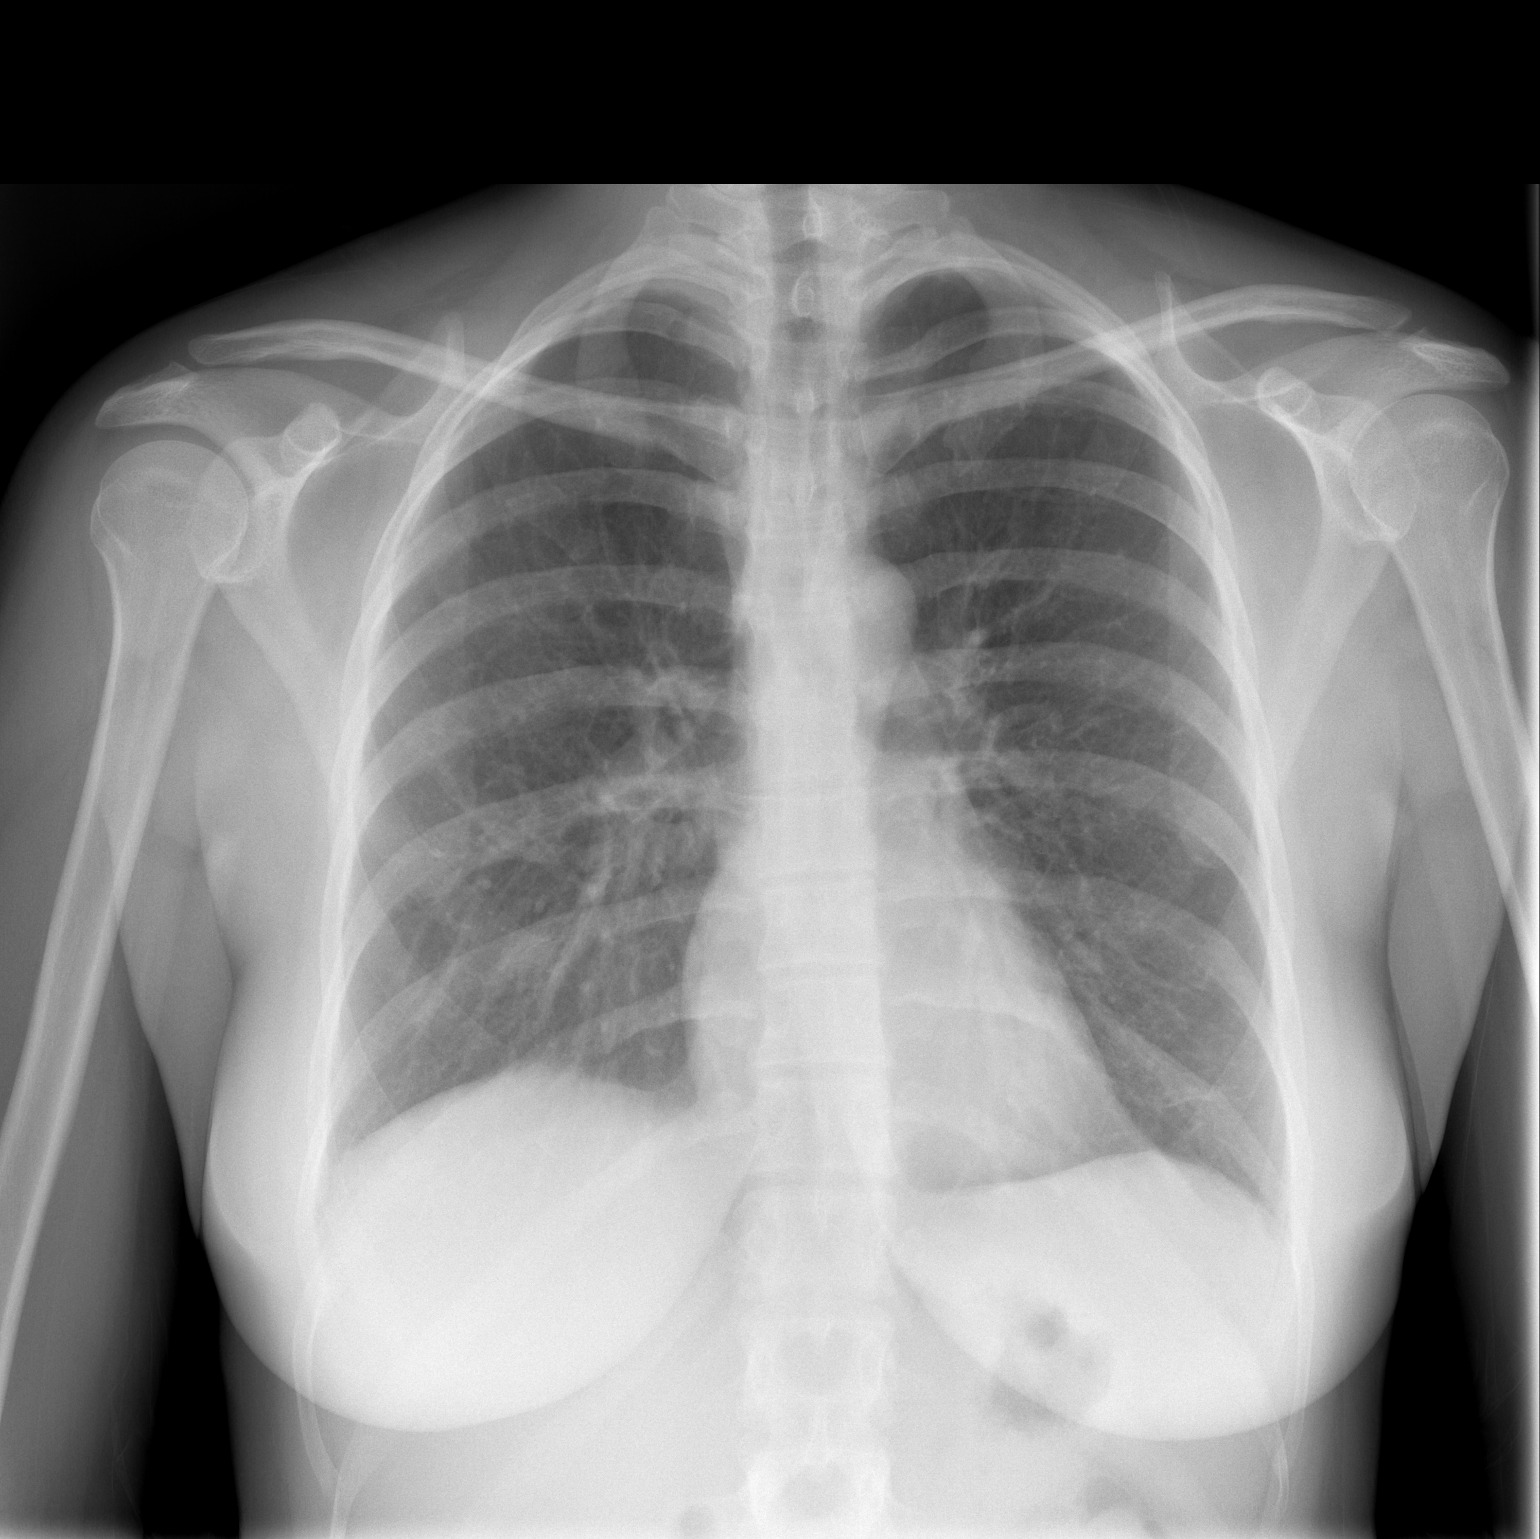

[w chest lat]
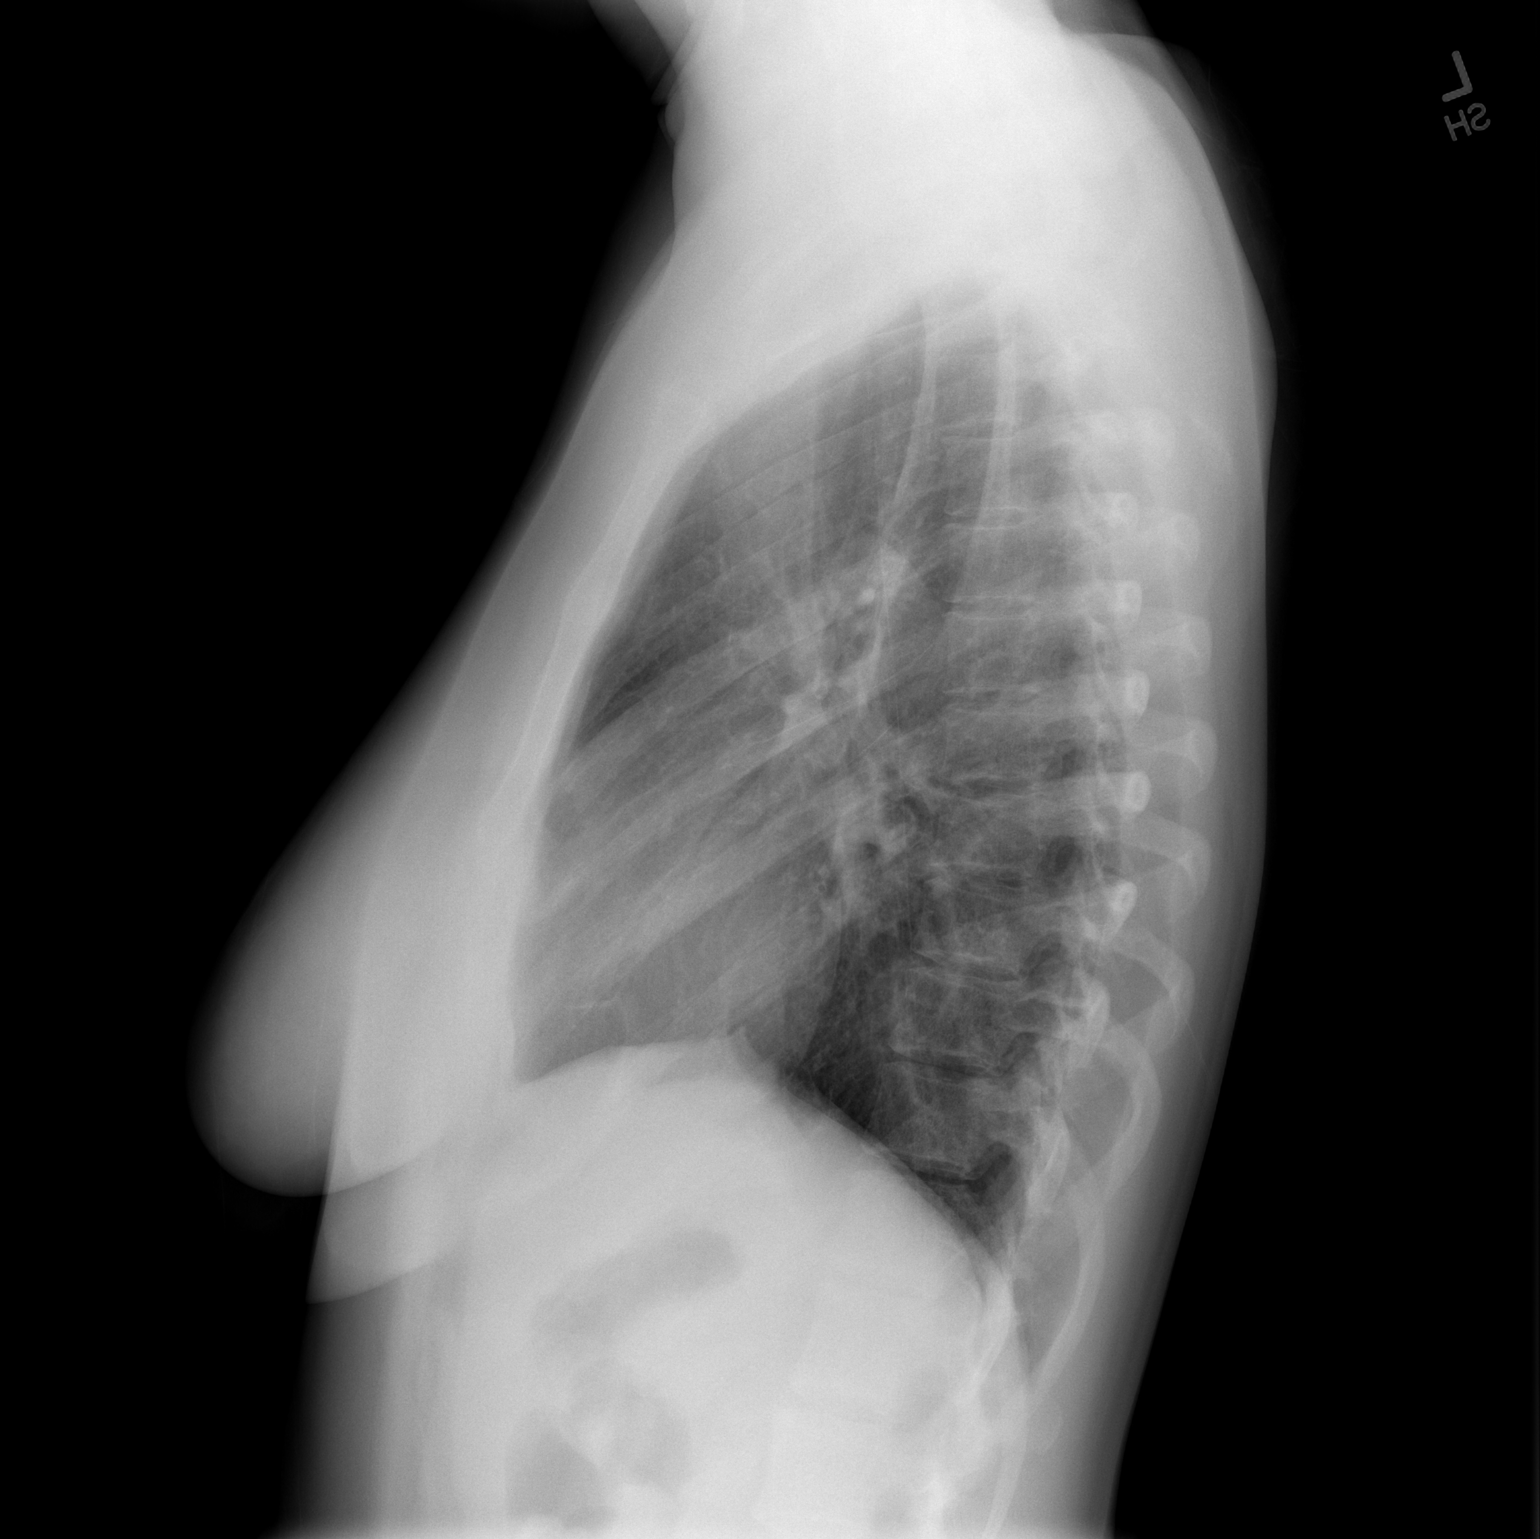

[2 of 2 positions shown; findings below may reference images not displayed]

FINDINGS: Cardiomediastinal silhouette is normal. Mediastinal contours appear
intact.

There is no evidence of focal airspace consolidation, pleural
effusion or pneumothorax.

Osseous structures are without acute abnormality. Soft tissues are
grossly normal.
IMPRESSION: No active cardiopulmonary disease.

## 2019-12-27 IMAGING — CT CT ANGIO CHEST
2 of 8 series · 19 of 36 positions shown · IV contrast (iopamidol)
Comparison: Chest radiograph 08/28/2018

CLINICAL DATA: Shortness of breath. Recent low diagnosis.

EXAM:
CT ANGIOGRAPHY CHEST WITH CONTRAST
TECHNIQUE: Multidetector CT imaging of the chest was performed using the
standard protocol during bolus administration of intravenous
contrast. Multiplanar CT image reconstructions and MIPs were
obtained to evaluate the vascular anatomy.
CONTRAST:  100mL 13P4L7-E8L IOPAMIDOL (13P4L7-E8L) INJECTION 76%

[Series 6: pe coronal mpr · coronal · 0.55mm/px · 1 of 113 slices shown]
[im 57/113  mediastinal]
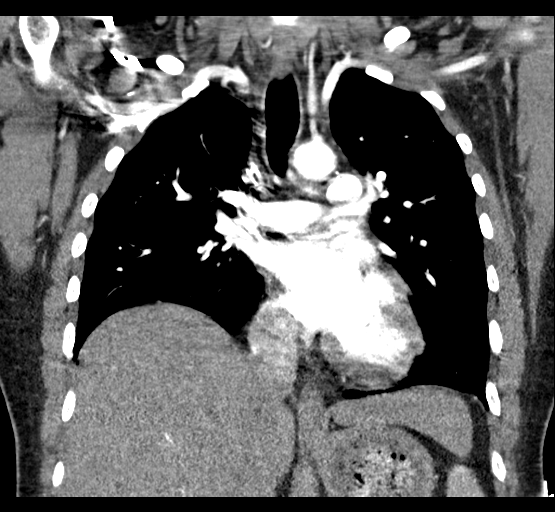

[Series 10: pe thins · axial · 0.64mm/px · z∈[-250,-12]mm · 18 of 266 slices shown]
[im 14/266  lung]
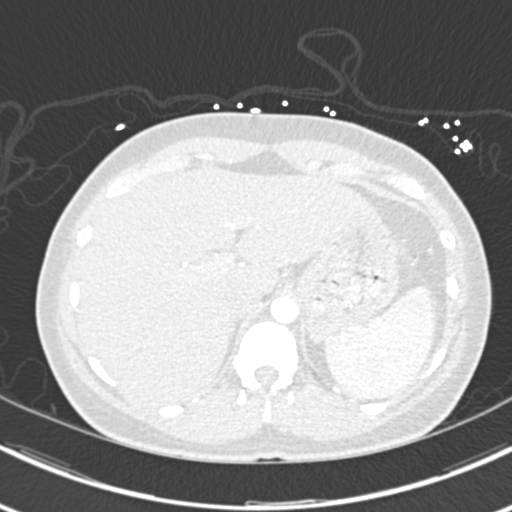
[im 28/266  mediastinal]
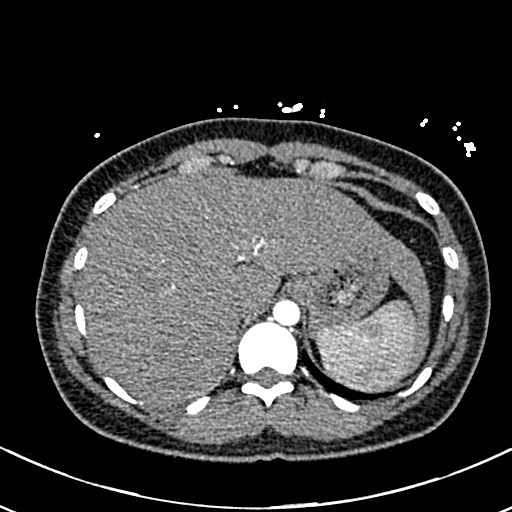
[im 42/266  lung]
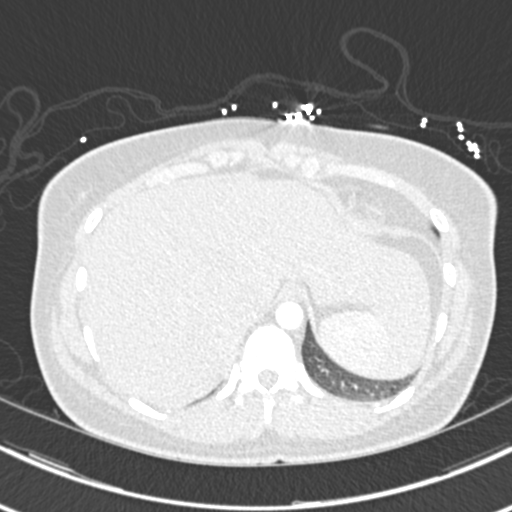
[im 56/266  mediastinal]
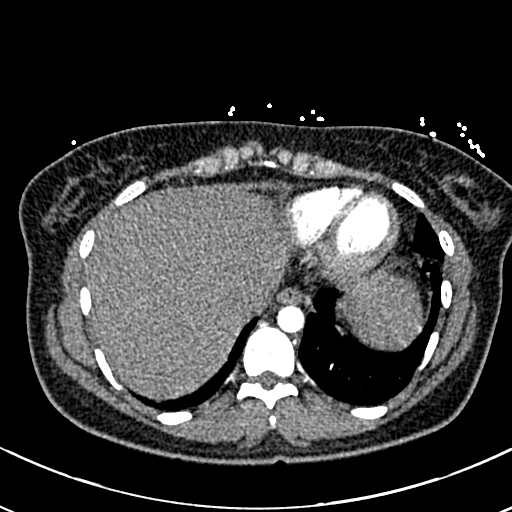
[im 70/266  lung]
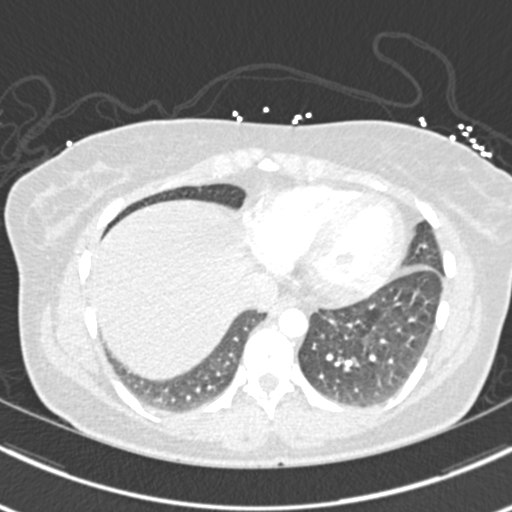
[im 84/266  mediastinal]
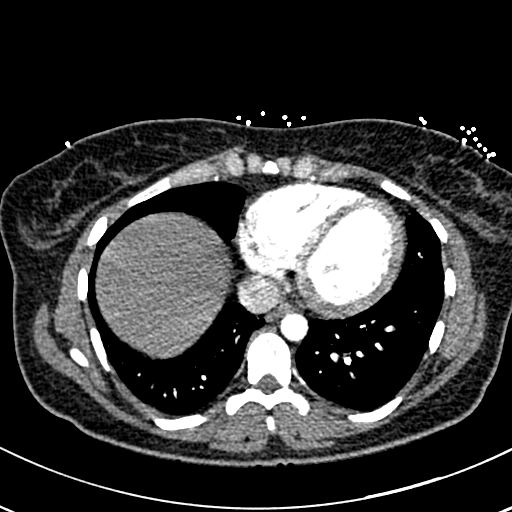
[im 98/266  lung]
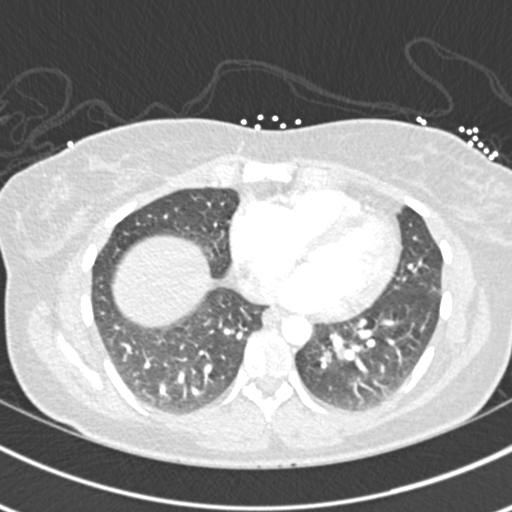
[im 112/266  mediastinal]
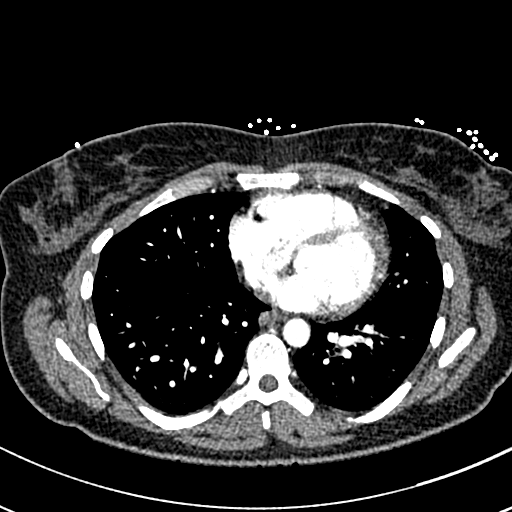
[im 126/266  lung]
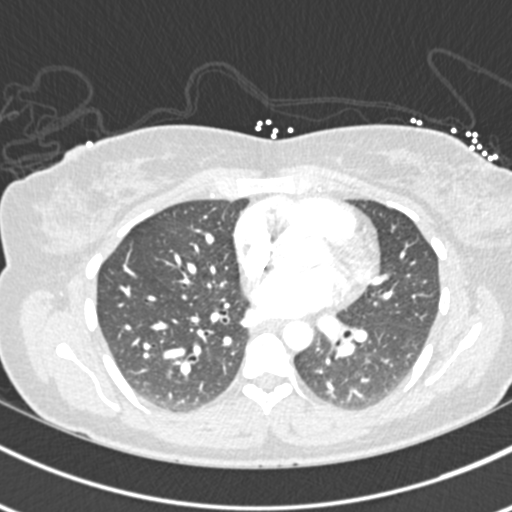
[im 140/266  mediastinal]
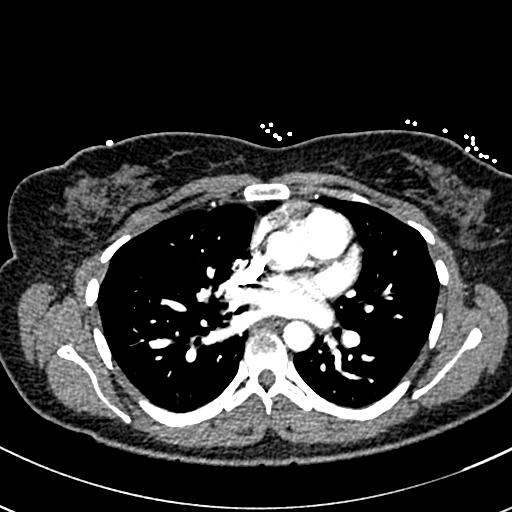
[im 154/266  lung]
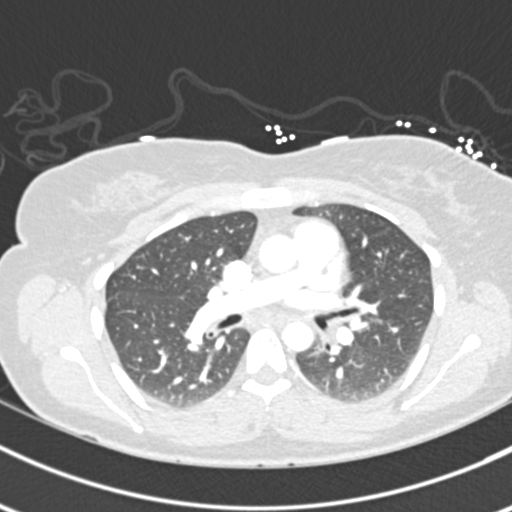
[im 168/266  mediastinal]
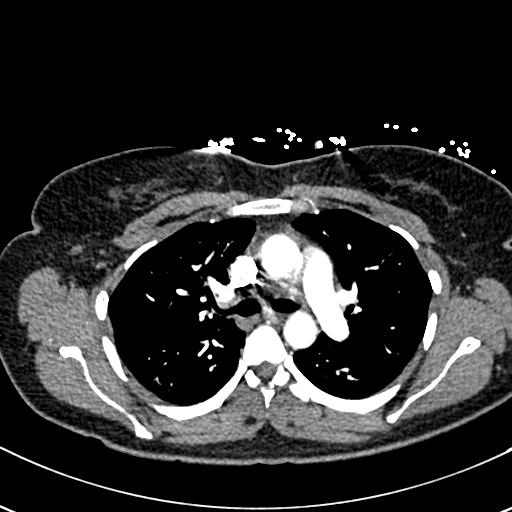
[im 182/266  lung]
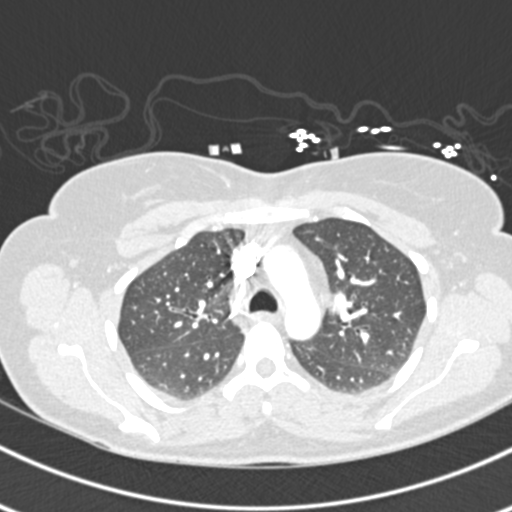
[im 196/266  mediastinal]
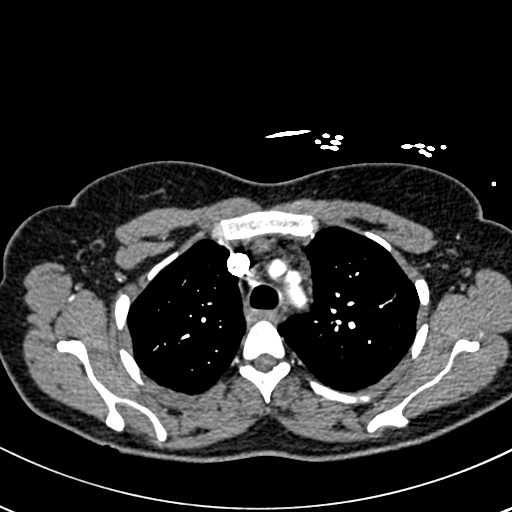
[im 210/266  lung]
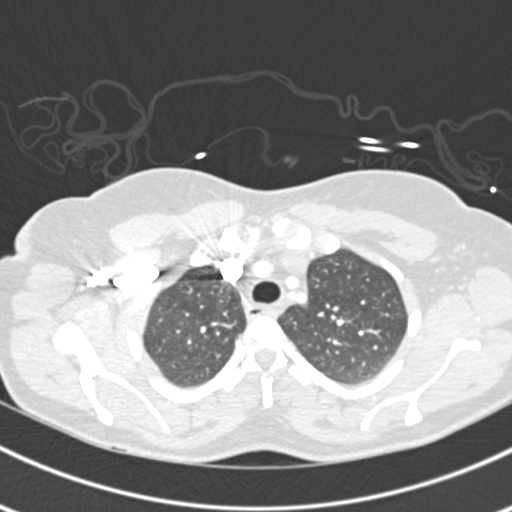
[im 224/266  mediastinal]
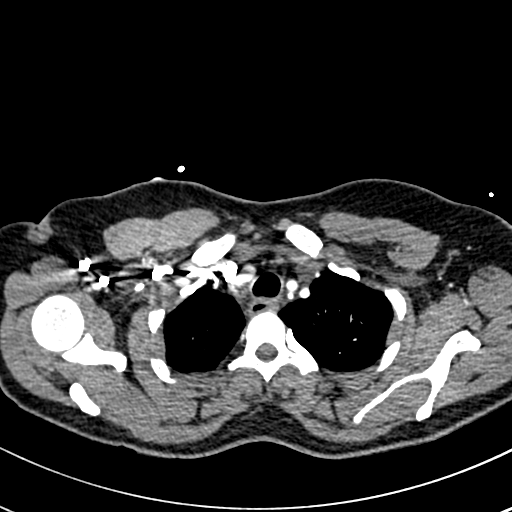
[im 238/266  lung]
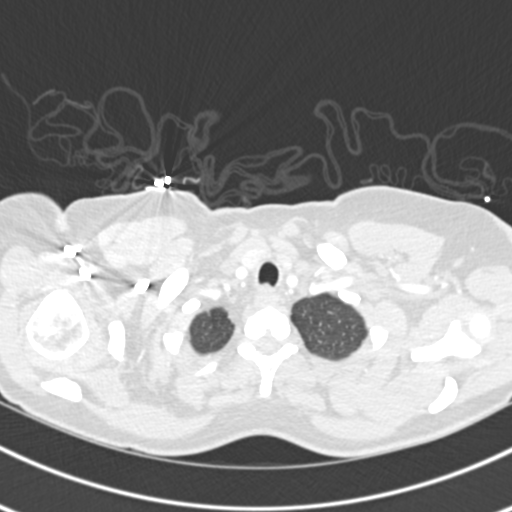
[im 252/266  mediastinal]
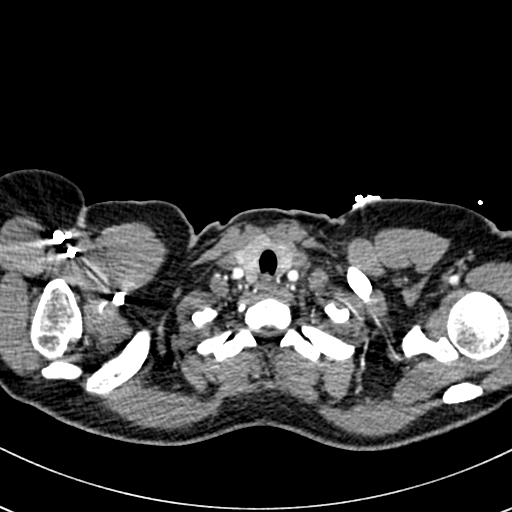

[19 of 36 positions shown; findings below may reference images not displayed]

FINDINGS: Cardiovascular: Satisfactory opacification of the pulmonary arteries
to the segmental level. No evidence of pulmonary embolism. Normal
heart size. No pericardial effusion.

Mediastinum/Nodes: No enlarged mediastinal, hilar, or axillary lymph
nodes. Thyroid gland, trachea, and esophagus demonstrate no
significant findings.

Lungs/Pleura: Lungs are clear. No pleural effusion or pneumothorax.

Upper Abdomen: No acute abnormality.

Musculoskeletal: No chest wall abnormality. No acute or significant
osseous findings.

Review of the MIP images confirms the above findings.
IMPRESSION: No evidence of pulmonary embolus. No acute abnormality seen in the
chest.

## 2020-10-23 ENCOUNTER — Encounter (HOSPITAL_BASED_OUTPATIENT_CLINIC_OR_DEPARTMENT_OTHER): Payer: Self-pay | Admitting: *Deleted

## 2020-10-23 ENCOUNTER — Other Ambulatory Visit: Payer: Self-pay

## 2020-10-23 ENCOUNTER — Emergency Department (HOSPITAL_BASED_OUTPATIENT_CLINIC_OR_DEPARTMENT_OTHER)
Admission: EM | Admit: 2020-10-23 | Discharge: 2020-10-24 | Disposition: A | Discharge status: Home / Self Care | Payer: 59 | Attending: Emergency Medicine | Admitting: Emergency Medicine

## 2020-10-23 DIAGNOSIS — Z28310 Unvaccinated for covid-19: Secondary | ICD-10-CM | POA: Insufficient documentation

## 2020-10-23 DIAGNOSIS — R112 Nausea with vomiting, unspecified: Secondary | ICD-10-CM

## 2020-10-23 DIAGNOSIS — B349 Viral infection, unspecified: Secondary | ICD-10-CM | POA: Insufficient documentation

## 2020-10-23 DIAGNOSIS — R197 Diarrhea, unspecified: Secondary | ICD-10-CM | POA: Insufficient documentation

## 2020-10-23 DIAGNOSIS — E876 Hypokalemia: Secondary | ICD-10-CM | POA: Insufficient documentation

## 2020-10-23 DIAGNOSIS — F17210 Nicotine dependence, cigarettes, uncomplicated: Secondary | ICD-10-CM | POA: Insufficient documentation

## 2020-10-23 DIAGNOSIS — Z20822 Contact with and (suspected) exposure to covid-19: Secondary | ICD-10-CM | POA: Insufficient documentation

## 2020-10-23 LAB — CBC WITH DIFFERENTIAL/PLATELET
Abs Immature Granulocytes: 0.02 10*3/uL (ref 0.00–0.07)
Basophils Absolute: 0 10*3/uL (ref 0.0–0.1)
Basophils Relative: 0 %
Eosinophils Absolute: 0 10*3/uL (ref 0.0–0.5)
Eosinophils Relative: 0 %
HCT: 41.3 % (ref 36.0–46.0)
Hemoglobin: 13.9 g/dL (ref 12.0–15.0)
Immature Granulocytes: 0 %
Lymphocytes Relative: 22 %
Lymphs Abs: 2.4 10*3/uL (ref 0.7–4.0)
MCH: 33.3 pg (ref 26.0–34.0)
MCHC: 33.7 g/dL (ref 30.0–36.0)
MCV: 99 fL (ref 80.0–100.0)
Monocytes Absolute: 0.7 10*3/uL (ref 0.1–1.0)
Monocytes Relative: 6 %
Neutro Abs: 7.7 10*3/uL (ref 1.7–7.7)
Neutrophils Relative %: 72 %
Platelets: 299 10*3/uL (ref 150–400)
RBC: 4.17 MIL/uL (ref 3.87–5.11)
RDW: 12.2 % (ref 11.5–15.5)
WBC: 10.9 10*3/uL — ABNORMAL HIGH (ref 4.0–10.5)
nRBC: 0 % (ref 0.0–0.2)

## 2020-10-23 MED ORDER — ONDANSETRON HCL 4 MG/2ML IJ SOLN
4.0000 mg | Freq: Once | INTRAMUSCULAR | Status: AC
  Administered 2020-10-23: 4 mg via INTRAVENOUS
  Filled 2020-10-23: qty 2

## 2020-10-23 MED ORDER — KETOROLAC TROMETHAMINE 30 MG/ML IJ SOLN
30.0000 mg | Freq: Once | INTRAMUSCULAR | Status: AC
  Administered 2020-10-23: 30 mg via INTRAVENOUS
  Filled 2020-10-23: qty 1

## 2020-10-23 MED ORDER — LACTATED RINGERS IV BOLUS
1000.0000 mL | Freq: Once | INTRAVENOUS | Status: AC
  Administered 2020-10-23: 1000 mL via INTRAVENOUS

## 2020-10-23 NOTE — ED Provider Notes (Signed)
[facility] EMERGENCY DEPARTMENT Provider Note   CSN: [id number] Arrival date & time: 10/23/20  2012   History Chief Complaint  Patient presents with  . Emesis    Heather Hines is a 35 y.o. female.  The history is provided by the patient.  Emesis She has history of attention deficit disorder, bipolar disorder and comes in because of generalized body aches, nausea, vomiting, diarrhea which started 2 days ago.  She states that she had about 6 watery bowel movements yesterday and has vomited about 8 times.  She has not had any diarrhea today but has vomited today.  Her nausea is only present if she tries to put something on her stomach.  Body aches are rated at 7/10.  She has not been able to take anything for her body aches because of her nausea and vomiting.  She denies fever or sweats but has had chills.  She denies any sick contacts.  She has not been vaccinated against COVID-19.  She denies rhinorrhea, sore throat, cough.  Past Medical History:  Diagnosis Date  . ADHD   . Bipolar depression (HCC)    followed by behavior health  . GAD (generalized anxiety disorder)   . History of abnormal cervical Pap smear   . 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 Labor and delivery, indication for care 04/30/2019    Past Surgical History:  Procedure Laterality Date  . INTRAUTERINE DEVICE (IUD) INSERTION N/A 08/24/2019   Procedure: INTRAUTERINE DEVICE (IUD) INSERTION;  Surgeon: Ranae Pila, MD;  Location: [facility];  Service: Gynecology;  Laterality: N/A;  PHYSICIAN TO BRING IUD FROM OFFICE  . LEEP  2015    gyn office  . NO PAST SURGERIES    . PERINEOPLASTY N/A 08/24/2019   Procedure: PERINEORRHAPHY;  Surgeon: Ranae Pila, MD;  Location: [facility];  Service: Gynecology;  Laterality: N/A;     OB History    Gravida  1   Para  1    Term      Preterm  1   AB      Living  1     SAB      IAB      Ectopic      Multiple  0   Live Births  1           Family History  Problem Relation Age of Onset  . Congestive Heart Failure Maternal Grandmother   . Parkinson's disease Paternal Grandfather     Social History   Tobacco Use  . Smoking status: Current Every Day Smoker    Packs/day: 0.50    Years: 16.00    Pack years: 8.00    Types: Cigarettes  . Smokeless tobacco: Never Used  Vaping Use  . Vaping Use: Never used  Substance Use Topics  . Alcohol use: Yes  . Drug use: Not Currently    Types: Marijuana    Comment: 07-31-2019  per pt in remission since 11-13-2010    Home Medications Prior to Admission medications   Medication Sig Start Date End Date Taking? Authorizing Provider  acetaminophen (TYLENOL) 325 MG tablet Take 650 mg by mouth every 6 (six) hours as needed.    [provider]  busPIRone (BUSPAR) 10 MG tablet Take 10 mg by mouth daily as needed.    [provider]  ibuprofen (ADVIL) 200 MG tablet Take 200 mg by mouth every 6 (six) hours as needed.    [provider]  lisdexamfetamine (VYVANSE) 40 MG capsule Take 40 mg by mouth every morning.    [provider]  omeprazole (PRILOSEC OTC) 20 MG tablet Take 20 mg by mouth as needed (heartburn).    [provider]  sertraline (ZOLOFT) 50 MG tablet Take 150 mg by mouth at bedtime.    [provider]    Allergies    Biaxin [clarithromycin]  Review of Systems   Review of Systems  Gastrointestinal: Positive for vomiting.  All other systems reviewed and are negative.   Physical Exam Updated Vital Signs BP 127/77 (BP Location: Right Arm)   Pulse 60   Temp 98.3 F (36.8 C) (Oral)   Resp 19   Ht 5\' 6"  (1.676 m)   Wt 72.2 kg   SpO2 98%   Breastfeeding No   BMI 25.69 kg/m   Physical Exam Vitals and nursing note reviewed.   35 year old female, resting comfortably and in no  acute distress. Vital signs are normal. Oxygen saturation is 98%, which is normal. Head is normocephalic and atraumatic. PERRLA, EOMI. Oropharynx is clear. Neck is nontender and supple without adenopathy or JVD. Back is nontender and there is no CVA tenderness. Lungs are clear without rales, wheezes, or rhonchi. Chest is nontender. Heart has regular rate and rhythm without murmur. Abdomen is soft, flat, nontender without masses or hepatosplenomegaly and peristalsis is hypoactive. Extremities have no cyanosis or edema, full range of motion is present. Skin is warm and dry without rash. Neurologic: Mental status is normal, cranial nerves are intact, there are no motor or sensory deficits.  ED Results / Procedures / Treatments   Labs (all labs ordered are listed, but only abnormal results are displayed) Labs Reviewed  URINALYSIS, ROUTINE W REFLEX MICROSCOPIC - Abnormal; Notable for the following components:      Result Value   APPearance HAZY (*)    All other components within normal limits  COMPREHENSIVE METABOLIC PANEL - Abnormal; Notable for the following components:   Potassium 3.3 (*)    Calcium 8.6 (*)    All other components within normal limits  CBC WITH DIFFERENTIAL/PLATELET - Abnormal; Notable for the following components:   WBC 10.9 (*)    All other components within normal limits  RESP PANEL BY RT-PCR (FLU A&B, COVID) ARPGX2  PREGNANCY, URINE    Procedures Procedures   Medications Ordered in ED Medications  potassium chloride SA (KLOR-CON) CR tablet 40 mEq (has no administration in time range)  lactated ringers bolus 1,000 mL (1,000 mLs Intravenous New Bag/Given 10/23/20 2345)  ondansetron (ZOFRAN) injection 4 mg (4 mg Intravenous Given 10/23/20 2342)  ketorolac (TORADOL) 30 MG/ML injection 30 mg (30 mg Intravenous Given 10/23/20 2342)    ED Course  I have reviewed the triage vital signs and the nursing notes.  Pertinent labs & imaging results that were available  during my care of the patient were reviewed by me and considered in my medical decision making (see chart for details).  MDM Rules/Calculators/A&P Nausea, vomiting, diarrhea, body aches, chills suggestive of viral illness.  Consider influenza, COVID-19, viral gastroenteritis.  Old records are reviewed, and she has no relevant past visits.  Will check screening labs and give IV fluids, ondansetron, ketorolac.  Labs show mild hypokalemia and she is given a dose of oral potassium.  She feels much better after above-noted treatment.  She  is discharged with prescription for ondansetron, told to use over-the-counter analgesics and over-the-counter loperamide as needed.  Return precautions discussed.  Final Clinical Impression(s) / ED Diagnoses Final diagnoses:  Nausea vomiting and diarrhea  Viral illness  Hypokalemia    Rx / DC Orders ED Discharge Orders         Ordered    ondansetron (ZOFRAN) 4 MG tablet  Every 6 hours PRN        10/24/20 0049           Dione Booze, MD 10/24/20 [phone]

## 2020-10-23 NOTE — ED Triage Notes (Signed)
Pt reports n/v x 7 since early Saturday am. Denies fever. Some diarrhea. Denies sick contacts. C/o body aches

## 2020-10-24 DIAGNOSIS — B349 Viral infection, unspecified: Secondary | ICD-10-CM | POA: Diagnosis not present

## 2020-10-24 LAB — COMPREHENSIVE METABOLIC PANEL WITH GFR
ALT: 20 U/L (ref 0–44)
AST: 23 U/L (ref 15–41)
Albumin: 3.7 g/dL (ref 3.5–5.0)
Alkaline Phosphatase: 52 U/L (ref 38–126)
Anion gap: 9 (ref 5–15)
BUN: 15 mg/dL (ref 6–20)
CO2: 24 mmol/L (ref 22–32)
Calcium: 8.6 mg/dL — ABNORMAL LOW (ref 8.9–10.3)
Chloride: 104 mmol/L (ref 98–111)
Creatinine, Ser: 0.83 mg/dL (ref 0.44–1.00)
GFR, Estimated: >60 mL/min
Glucose, Bld: 91 mg/dL (ref 70–99)
Potassium: 3.3 mmol/L — ABNORMAL LOW (ref 3.5–5.1)
Sodium: 137 mmol/L (ref 135–145)
Total Bilirubin: 0.6 mg/dL (ref 0.3–1.2)
Total Protein: 6.5 g/dL (ref 6.5–8.1)

## 2020-10-24 LAB — URINALYSIS, ROUTINE W REFLEX MICROSCOPIC
Bilirubin Urine: NEGATIVE
Glucose, UA: NEGATIVE mg/dL
Hgb urine dipstick: NEGATIVE
Ketones, ur: NEGATIVE mg/dL
Leukocytes,Ua: NEGATIVE
Nitrite: NEGATIVE
Protein, ur: NEGATIVE mg/dL
Specific Gravity, Urine: 1.025 (ref 1.005–1.030)
pH: 6 (ref 5.0–8.0)

## 2020-10-24 LAB — PREGNANCY, URINE: Preg Test, Ur: NEGATIVE

## 2020-10-24 LAB — RESP PANEL BY RT-PCR (FLU A&B, COVID) ARPGX2
Influenza A by PCR: NEGATIVE
Influenza B by PCR: NEGATIVE
SARS Coronavirus 2 by RT PCR: NEGATIVE

## 2020-10-24 MED ORDER — ONDANSETRON HCL 4 MG PO TABS: 4.0000 mg | ORAL_TABLET | Freq: Four times a day (QID) | ORAL | 0 refills | Status: DC | PRN

## 2020-10-24 MED ORDER — POTASSIUM CHLORIDE CRYS ER 20 MEQ PO TBCR
40.0000 meq | EXTENDED_RELEASE_TABLET | Freq: Once | ORAL | Status: AC
  Administered 2020-10-24: 40 meq via ORAL
  Filled 2020-10-24: qty 2

## 2020-10-24 NOTE — Discharge Instructions (Addendum)
Take loperamide (Imodium A-D) as needed for diarrhea.  Take acetaminophen and/or ibuprofen as needed for fever or body aches.  Return to the emergency department if symptoms are getting worse.

## 2021-05-16 ENCOUNTER — Other Ambulatory Visit: Payer: Self-pay | Admitting: Infectious Diseases

## 2021-05-17 ENCOUNTER — Encounter: Payer: Self-pay | Admitting: Infectious Diseases

## 2021-05-17 ENCOUNTER — Ambulatory Visit (INDEPENDENT_AMBULATORY_CARE_PROVIDER_SITE_OTHER): Payer: Medicaid Other | Admitting: Infectious Diseases

## 2021-05-17 ENCOUNTER — Other Ambulatory Visit: Payer: Self-pay

## 2021-05-17 VITALS — BP 136/80 | HR 73 | Temp 98.2°F | Wt 153.0 lb

## 2021-05-17 DIAGNOSIS — L732 Hidradenitis suppurativa: Secondary | ICD-10-CM | POA: Diagnosis not present

## 2021-05-17 DIAGNOSIS — F17200 Nicotine dependence, unspecified, uncomplicated: Secondary | ICD-10-CM

## 2021-05-17 DIAGNOSIS — Z5181 Encounter for therapeutic drug level monitoring: Secondary | ICD-10-CM | POA: Diagnosis not present

## 2021-05-17 DIAGNOSIS — F319 Bipolar disorder, unspecified: Secondary | ICD-10-CM | POA: Diagnosis not present

## 2021-05-17 DIAGNOSIS — F1990 Other psychoactive substance use, unspecified, uncomplicated: Secondary | ICD-10-CM | POA: Diagnosis not present

## 2021-05-17 DIAGNOSIS — B379 Candidiasis, unspecified: Secondary | ICD-10-CM

## 2021-05-17 NOTE — Progress Notes (Addendum)
[facility]                                                             [address], [place], [place], [address]                                                                  Phn. [phone]; Fax: [phone]                                                                             Date: 05/17/21  Reason for Referral: Chronic vaginal candidiasis Requesting  Provider:  Assessment ? Concern for chronic vulvovaginal candidiasis She denies any symptoms of vulvovaginal candidiasis whatsoever and has been told that she has a yeast infection in her blood although I do not have any reports as such from reports sent from PCP. Following are the reports sent in her referral. I have told her to sign papers for release of all medical records ( esp blood cultures ) from her PCP. However, I have low suspicion that she had a  true fungemia based on her clinical history and  presentation   Labs from PCP  Candida antibodies IgG negative, IgM negative, IgA  positive 10/25 NU swab BV, candida NAA 03/20/21- candida albicans  Positive   H/o IVDU - agreeable to HIV and HCV testing  Smoking - counseling done Hidradenitis suppurativa - healed lesions in bilateral arm pits and bilateral groins ADHD, Bipolar depression, GAD: counseled to follow up with psychiatrist  Medication Monitoring   Plan Obtain complete medical records from PCP esp blood cultures  HIV ab and HCV ab testing  CMP given she is on fluconazole for last 2 months  No need to continue Fluconazole as she does not have any GU symptoms of vaginal candidiasis Follow up to be made in 1-2 weeks once medical records obtained and reviewed.    All questions and concerns were discussed and addressed. Patient verbalized understanding of the  plan. ____________________________________________________________________________________________________________________  HPI: 35 Y O female with a PMH of ADHD, Bipolar depression, GAD, h/o substance abuse, PCOS , h/o IVDU, Hidradeneitis suppurativa who is referred from PCP for evaluation of chronic candidiasis of vulva and vagina. She tells me she has been having bouts of " purge of infection " every 4-6 weeks where she starts having nausea, vomiting, diarrhea and cold sweats since April 2022. She was told by her PCP that she had a blood infection with yeast when she was checked by her PCP in July and October ( Denies being sick or admitted to the hospital, she tells me her blood work was done outpatient). She was treated with antifungals back in July and has been taking Fluconazole twice a week for the last 2 months.  She tells me she has also been treated for BV in the past. She also has h/o Hidradenitis and it mostly involves her bilateral armpits, does not have any active symptoms currently.   Denies any GU symptoms whatsoever ( denies urinary burning, increased frequency, hematuria; denies any vaginal discharge, itching or dyspareunia). She is sexually active with one female partner who is also father of her 33 year old daughter. Smokes 1 pack of cigarettes a day, 1-2 glasses of wine every day, has used IVD in the past; clean for 10 years. She was laid off from her work recently. She has an IUD in place.  She has recently stopped taking her medications for her Psychiatric conditions and does not follow up with psychiatrist.   Denies fevers, chills and sweats Denies cough, chest pain, SOB Denies nausea/vomiting, abdominal pain and diarrhea currently. No changes in appetite and weight  Denies any joint pain/back pain and rashes Denies any headache, focal weakness, numbness and tingling   ROS: 12 point ROS done with pertinent positives and negatives as listed above   Past Medical History:   Diagnosis Date   ADHD    Bipolar depression (HCC)    followed by behavior health   GAD (generalized anxiety disorder)    History of abnormal cervical Pap smear    Substance abuse in remission [facility](HCC)    per pt in remission since 11-13-2010   Past Surgical History:  Procedure Laterality Date   INTRAUTERINE DEVICE (IUD) INSERTION N/A 08/24/2019   Procedure: INTRAUTERINE DEVICE (IUD) INSERTION;  Surgeon: Ranae PilaLeger, Elise Jennifer, MD;  [facility];  Service: Gynecology;  Laterality: N/A;  PHYSICIAN TO BRING IUD FROM OFFICE   LEEP  2015    gyn office   NO PAST SURGERIES     PERINEOPLASTY N/A 08/24/2019   Procedure: PERINEORRHAPHY;  Surgeon: Ranae PilaLeger, Elise Jennifer, MD;  [facility] [place];  Service: Gynecology;  Laterality: N/A;   Current Outpatient Medications on File Prior to Visit  Medication Sig Dispense Refill   acetaminophen (TYLENOL) 325 MG tablet Take 650 mg by mouth every 6 (six) hours as needed.     ibuprofen (ADVIL) 200 MG tablet Take 200 mg by mouth every 6 (six) hours as needed.     omeprazole (PRILOSEC OTC) 20 MG tablet Take 20 mg by mouth as needed (heartburn).     amphetamine-dextroamphetamine (ADDERALL) 5 MG tablet Take 1 tablet by mouth daily.     No current facility-administered medications on file prior to visit.   Allergies  Allergen Reactions   Biaxin [Clarithromycin] Other (See Comments)    Unknown childhood reaction   Social History   Socioeconomic History   Marital status: Legally Separated    Spouse name: Not on file   Number of children: Not on file   Years of education: Not on file   Highest education level: Not on file  Occupational History   Not on file  Tobacco Use   Smoking status: Every Day    Packs/day: 0.50    Years: 16.00    Pack years: 8.00    Types: Cigarettes   Smokeless tobacco: Never  Vaping Use   Vaping Use: Never used  Substance and Sexual Activity   Alcohol use: Yes   Drug use: Not  Currently    Types: Marijuana    Comment: 07-31-2019  per pt in remission since 11-13-2010   Sexual activity: Yes    Birth control/protection: None  Other Topics Concern  Not on file  Social History Narrative   Not on file   Social Determinants of Health   Financial Resource Strain: Not on file  Food Insecurity: Not on file  Transportation Needs: Not on file  Physical Activity: Not on file  Stress: Not on file  Social Connections: Not on file  Intimate Partner Violence: Not on file   Family History  Problem Relation Age of Onset   Congestive Heart Failure Maternal Grandmother    Parkinson's disease Paternal Grandfather     Vitals BP 136/80   Pulse 73   Temp 98.2 F (36.8 C) (Oral)   Wt 153 lb (69.4 kg)   SpO2 98%   BMI 24.69 kg/m   Examination  General - not in acute distress, comfortably sitting in chair HEENT - PEERLA, no pallor and no icterus Chest - b/l clear air entry, no additional sounds CVS- Normal s1s2, RRR Abdomen - Soft, Non tender , non distended Ext- no pedal edema Neuro: grossly normal Back - WNL Psych : calm and cooperative Skin- has healed HS  lesions in bilateral axilla and bilateral groin   Recent labs CBC Latest Ref Rng & Units 10/23/2020 05/02/2019 04/30/2019  WBC 4.0 - 10.5 K/uL 10.9(H) 18.8(H) 14.8(H)  Hemoglobin 12.0 - 15.0 g/dL 13.9 10.8(L) 12.6  Hematocrit 36.0 - 46.0 % 41.3 33.7(L) 35.9(L)  Platelets 150 - 400 K/uL 299 372 394   CMP Latest Ref Rng & Units 10/23/2020 08/28/2018 11/12/2010  Glucose 70 - 99 mg/dL 91 102(H) 79  BUN 6 - 20 mg/dL 15 12 8   Creatinine 0.44 - 1.00 mg/dL 0.83 0.79 1.00  Sodium 135 - 145 mmol/L 137 137 141  Potassium 3.5 - 5.1 mmol/L 3.3(L) 3.7 3.3(L)  Chloride 98 - 111 mmol/L 104 110 107  CO2 22 - 32 mmol/L 24 20(L) 23  Calcium 8.9 - 10.3 mg/dL 8.6(L) 8.3(L) 9.2  Total Protein 6.5 - 8.1 g/dL 6.5 6.6 6.9  Total Bilirubin 0.3 - 1.2 mg/dL 0.6 0.6 0.1(L)  Alkaline Phos 38 - 126 U/L 52 64 56  AST 15 - 41 U/L  23 28 16   ALT 0 - 44 U/L 20 22 12      Pertinent Microbiology Results for orders placed or performed during the hospital encounter of 10/23/20  Resp Panel by RT-PCR (Flu A&B, Covid) Nasopharyngeal Swab     Status: None   Collection Time: 10/23/20 11:40 PM   Specimen: Nasopharyngeal Swab; Nasopharyngeal(NP) swabs in vial transport medium  Result Value Ref Range Status   SARS Coronavirus 2 by RT PCR NEGATIVE NEGATIVE Final    Comment: (NOTE) SARS-CoV-2 target nucleic acids are NOT DETECTED.  The SARS-CoV-2 RNA is generally detectable in upper respiratory specimens during the acute phase of infection. The lowest concentration of SARS-CoV-2 viral copies this assay can detect is 138 copies/mL. A negative result does not preclude SARS-Cov-2 infection and should not be used as the sole basis for treatment or other patient management decisions. A negative result may occur with  improper specimen collection/handling, submission of specimen other than nasopharyngeal swab, presence of viral mutation(s) within the areas targeted by this assay, and inadequate number of viral copies(<138 copies/mL). A negative result must be combined with clinical observations, patient history, and epidemiological information. The expected result is Negative.  Fact Sheet for Patients:  EntrepreneurPulse.com.au  Fact Sheet for Healthcare Providers:  IncredibleEmployment.be  This test is no t yet approved or cleared by the [place] FDA and  has been authorized for detection and/or  diagnosis of SARS-CoV-2 by FDA under an Emergency Use Authorization (EUA). This EUA will remain  in effect (meaning this test can be used) for the duration of the COVID-19 declaration under Section 564(b)(1) of the Act, 21 U.S.C.section 360bbb-3(b)(1), unless the authorization is terminated  or revoked sooner.       Influenza A by PCR NEGATIVE NEGATIVE Final   Influenza B by PCR NEGATIVE  NEGATIVE Final    Comment: (NOTE) The Xpert Xpress SARS-CoV-2/FLU/RSV plus assay is intended as an aid in the diagnosis of influenza from Nasopharyngeal swab specimens and should not be used as a sole basis for treatment. Nasal washings and aspirates are unacceptable for Xpert Xpress SARS-CoV-2/FLU/RSV testing.  Fact Sheet for Patients: BloggerCourse.com  Fact Sheet for Healthcare Providers: SeriousBroker.it  This test is not yet approved or cleared by the [place] FDA and has been authorized for detection and/or diagnosis of SARS-CoV-2 by FDA under an Emergency Use Authorization (EUA). This EUA will remain in effect (meaning this test can be used) for the duration of the COVID-19 declaration under Section 564(b)(1) of the Act, 21 U.S.C. section 360bbb-3(b)(1), unless the authorization is terminated or revoked.  Performed at [facility], [address] [place], [place] [address]     Pertinent Imaging All pertinent labs/Imagings/notes reviewed. All pertinent plain films and CT images have been personally visualized and interpreted; radiology reports have been reviewed. Decision making incorporated into the Impression / Recommendations.  I have spent a total of 80  minutes of face-to-face and non-face-to-face time, excluding clinical staff time, preparing to see patient, ordering tests and/or medications, and provide counseling the patient    Electronically signed by:  Odette Fraction, MD Infectious Disease Physician [facility] [address] [place], [place] [address] Phone: [phone]  Fax: [phone]

## 2021-05-18 LAB — COMPREHENSIVE METABOLIC PANEL
AG Ratio: 2 (calc) (ref 1.0–2.5)
ALT: 19 U/L (ref 6–29)
AST: 18 U/L (ref 10–30)
Albumin: 4.6 g/dL (ref 3.6–5.1)
Alkaline phosphatase (APISO): 58 U/L (ref 31–125)
BUN: 12 mg/dL (ref 7–25)
CO2: 22 mmol/L (ref 20–32)
Calcium: 9.6 mg/dL (ref 8.6–10.2)
Chloride: 106 mmol/L (ref 98–110)
Creat: 0.93 mg/dL (ref 0.50–0.97)
Globulin: 2.3 g/dL (calc) (ref 1.9–3.7)
Glucose, Bld: 84 mg/dL (ref 65–99)
Potassium: 4.2 mmol/L (ref 3.5–5.3)
Sodium: 140 mmol/L (ref 135–146)
Total Bilirubin: 0.6 mg/dL (ref 0.2–1.2)
Total Protein: 6.9 g/dL (ref 6.1–8.1)

## 2021-05-18 LAB — HEPATITIS C ANTIBODY
Hepatitis C Ab: NONREACTIVE
SIGNAL TO CUT-OFF: 0.08 (ref <1.00)

## 2021-05-18 LAB — HIV ANTIBODY (ROUTINE TESTING W REFLEX): HIV 1&2 Ab, 4th Generation: NONREACTIVE

## 2021-05-26 ENCOUNTER — Telehealth: Payer: Self-pay

## 2021-05-26 NOTE — Telephone Encounter (Signed)
Called patient to schedule phone visit, no answer. Left HIPAA compliant voicemail requesting callback.   Sandie Ano, RN

## 2021-05-26 NOTE — Telephone Encounter (Signed)
-----   Message from Odette Fraction, MD sent at 05/26/2021  1:50 PM EST ----- Regarding: Follow up visit Hi team,  Please schedule a phone visit with the patient sometime in next 1-2 weeks to go over labs received from her PCP. Thanks.

## 2021-05-26 NOTE — Telephone Encounter (Signed)
Patient returned call, scheduled for 11/22 phone visit.    Sandie Ano, RN

## 2021-05-30 ENCOUNTER — Other Ambulatory Visit: Payer: Self-pay

## 2021-05-30 ENCOUNTER — Telehealth (INDEPENDENT_AMBULATORY_CARE_PROVIDER_SITE_OTHER): Payer: Medicaid Other | Admitting: Infectious Diseases

## 2021-05-30 DIAGNOSIS — B379 Candidiasis, unspecified: Secondary | ICD-10-CM | POA: Diagnosis not present

## 2021-05-30 DIAGNOSIS — F1990 Other psychoactive substance use, unspecified, uncomplicated: Secondary | ICD-10-CM

## 2021-05-30 NOTE — Progress Notes (Signed)
Virtual Visit via Telephone Note  I connected withNAME@ on 05/30/21 at 11:00 AM EST by a telephone enabled telemedicine application and verified that I am speaking with the correct person using two identifiers.  Location: Patient: Home  Provider: [facility]   I discussed the limitations of evaluation and management by telemedicine and the availability of in person appointments. The patient expressed understanding and agreed to proceed.  [facility]  Patient Active Problem List   Diagnosis Date Noted   Smoking 05/17/2021   Candidiasis 05/17/2021   IVDU (intravenous drug user) 05/17/2021   Hidradenitis suppurativa 05/17/2021   Bipolar depression (HCC) 05/17/2021   Medication monitoring encounter 05/17/2021   Labor and delivery, indication for care 04/30/2019    Patient's Medications  New Prescriptions   No medications on file  Previous Medications   ACETAMINOPHEN (TYLENOL) 325 MG TABLET    Take 650 mg by mouth every 6 (six) hours as needed.   AMPHETAMINE-DEXTROAMPHETAMINE (ADDERALL) 5 MG TABLET    Take 1 tablet by mouth daily.   IBUPROFEN (ADVIL) 200 MG TABLET    Take 200 mg by mouth every 6 (six) hours as needed.   OMEPRAZOLE (PRILOSEC OTC) 20 MG TABLET    Take 20 mg by mouth as needed (heartburn).  Modified Medications   No medications on file  Discontinued Medications   No medications on file    History of Present Illness: Here for phone follow up to discuss lab results from PCP. There was no report of any positive blood cultures growing yeast. Blood serology was positive for candida IgA and nothing else. I discussed with the patient that per manufacturers comment, this test is used for research purpose only and it is just indicative of a response to possible mucocutaneous yeast infection. She denies any symptoms of vulvovaginal candidiasis at this time and does not need to take fluconazole currently. No other questions or concerns.   ROS Denies fevers,  chills. Denies any GU symptoms. Denies any appetite and wieght changes   Past Medical History:  Diagnosis Date   ADHD    Bipolar depression (HCC)    followed by behavior health   GAD (generalized anxiety disorder)    History of abnormal cervical Pap smear    Substance abuse in remission (HCC)    per pt in remission since 11-13-2010    Social History   Tobacco Use   Smoking status: Every Day    Packs/day: 0.50    Years: 16.00    Pack years: 8.00    Types: Cigarettes   Smokeless tobacco: Never  Vaping Use   Vaping Use: Never used  Substance Use Topics   Alcohol use: Yes   Drug use: Not Currently    Types: Marijuana    Comment: 07-31-2019  per pt in remission since 11-13-2010    Family History  Problem Relation Age of Onset   Congestive Heart Failure Maternal Grandmother    Parkinson's disease Paternal Grandfather     Allergies  Allergen Reactions   Biaxin [Clarithromycin] Other (See Comments)    Unknown childhood reaction    Health Maintenance  Topic Date Due   COVID-19 Vaccine (1) Never done   Pneumococcal Vaccine 65-92 Years old (1 - PCV) Never done   TETANUS/TDAP  Never done   PAP SMEAR-Modifier  Never done   INFLUENZA VACCINE  Never done   Hepatitis C Screening  Completed   HIV Screening  Completed   HPV VACCINES  Aged Out  Observations/Objective:   Assessment and Plan: ? Concern for candidiasis Candida antibodies IgG negative, IgM negative, IgA  positive 10/25 NU swab BV, candida NAA 03/20/21- candida albicans  Positive  Denies any GU symptoms currently  and has received adequate tx for vaginal candidiasis in the past  H/o IVDU  Informed she has tested negative for HIV and HCV  Follow Up Instructions: No indication of fluconazole currently  Fu as needed   I discussed the assessment and treatment plan with the patient. The patient was provided an opportunity to ask questions and all were answered. The patient agreed with the plan and  demonstrated an understanding of the instructions.   The patient was advised to call back or seek an in-person evaluation if the symptoms worsen or if the condition fails to improve as anticipated.  I provided 15 minutes of non-face-to-face time during this encounter.  Victoriano Lain, MD [facility] Phone [phone] Fax no. [phone]  05/30/2021, 11:09 AM

## 2021-09-03 ENCOUNTER — Other Ambulatory Visit: Payer: Self-pay

## 2021-09-03 ENCOUNTER — Emergency Department (HOSPITAL_BASED_OUTPATIENT_CLINIC_OR_DEPARTMENT_OTHER)
Admission: EM | Admit: 2021-09-03 | Discharge: 2021-09-03 | Disposition: A | Discharge status: Home / Self Care | Payer: Medicaid Other | Attending: Emergency Medicine | Admitting: Emergency Medicine

## 2021-09-03 ENCOUNTER — Encounter (HOSPITAL_BASED_OUTPATIENT_CLINIC_OR_DEPARTMENT_OTHER): Payer: Self-pay | Admitting: Emergency Medicine

## 2021-09-03 DIAGNOSIS — S52302A Unspecified fracture of shaft of left radius, initial encounter for closed fracture: Secondary | ICD-10-CM | POA: Diagnosis not present

## 2021-09-03 DIAGNOSIS — N9489 Other specified conditions associated with female genital organs and menstrual cycle: Secondary | ICD-10-CM | POA: Diagnosis not present

## 2021-09-03 DIAGNOSIS — Y92410 Unspecified street and highway as the place of occurrence of the external cause: Secondary | ICD-10-CM | POA: Diagnosis not present

## 2021-09-03 DIAGNOSIS — S59912A Unspecified injury of left forearm, initial encounter: Secondary | ICD-10-CM | POA: Diagnosis present

## 2021-09-03 DIAGNOSIS — S5292XA Unspecified fracture of left forearm, initial encounter for closed fracture: Secondary | ICD-10-CM

## 2021-09-03 DIAGNOSIS — M79632 Pain in left forearm: Secondary | ICD-10-CM | POA: Diagnosis not present

## 2021-09-03 LAB — HCG, SERUM, QUALITATIVE: Preg, Serum: NEGATIVE

## 2021-09-03 MED ORDER — MORPHINE SULFATE (PF) 4 MG/ML IV SOLN
4.0000 mg | Freq: Once | INTRAVENOUS | Status: AC
  Administered 2021-09-03: 4 mg via INTRAVENOUS
  Filled 2021-09-03: qty 1

## 2021-09-03 MED ORDER — KETAMINE HCL 10 MG/ML IJ SOLN
0.3000 mg/kg | Freq: Once | INTRAMUSCULAR | Status: AC
  Administered 2021-09-03: 19 mg via INTRAVENOUS
  Filled 2021-09-03: qty 1
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
## 2021-09-03 NOTE — Discharge Instructions (Addendum)
Follow-up with Dr. Tamera Punt at 9 AM tomorrow.  Return to emergency room if you have any worsening symptoms.
